# Patient Record
Sex: Female | Born: 1940 | Race: White | Hispanic: No | Marital: Single | Smoking: Former smoker
Health system: Southern US, Community
[De-identification: ages and names within clinical notes are randomized; demographics above are authoritative.]

## PROBLEM LIST (undated history)

## (undated) DIAGNOSIS — I482 Chronic atrial fibrillation, unspecified: Secondary | ICD-10-CM

## (undated) DIAGNOSIS — F0390 Unspecified dementia without behavioral disturbance: Secondary | ICD-10-CM

---

## 2008-05-17 ENCOUNTER — Ambulatory Visit: Payer: Self-pay | Admitting: Internal Medicine

## 2011-04-17 ENCOUNTER — Ambulatory Visit: Payer: Self-pay | Admitting: Internal Medicine

## 2016-04-04 ENCOUNTER — Encounter: Payer: Self-pay | Admitting: Emergency Medicine

## 2016-04-04 ENCOUNTER — Inpatient Hospital Stay: Payer: Medicare HMO

## 2016-04-04 ENCOUNTER — Emergency Department: Payer: Medicare HMO

## 2016-04-04 ENCOUNTER — Inpatient Hospital Stay
Admission: EM | Admit: 2016-04-04 | Discharge: 2016-04-07 | DRG: 065 | Disposition: A | Discharge status: Home / Self Care | Payer: Medicare HMO | Attending: Internal Medicine | Admitting: Internal Medicine

## 2016-04-04 DIAGNOSIS — Z87891 Personal history of nicotine dependence: Secondary | ICD-10-CM | POA: Diagnosis not present

## 2016-04-04 DIAGNOSIS — I63512 Cerebral infarction due to unspecified occlusion or stenosis of left middle cerebral artery: Principal | ICD-10-CM | POA: Diagnosis present

## 2016-04-04 DIAGNOSIS — G8191 Hemiplegia, unspecified affecting right dominant side: Secondary | ICD-10-CM | POA: Diagnosis present

## 2016-04-04 DIAGNOSIS — I4892 Unspecified atrial flutter: Secondary | ICD-10-CM | POA: Diagnosis present

## 2016-04-04 DIAGNOSIS — R29810 Facial weakness: Secondary | ICD-10-CM | POA: Diagnosis present

## 2016-04-04 DIAGNOSIS — N390 Urinary tract infection, site not specified: Secondary | ICD-10-CM | POA: Diagnosis present

## 2016-04-04 DIAGNOSIS — F0390 Unspecified dementia without behavioral disturbance: Secondary | ICD-10-CM | POA: Diagnosis present

## 2016-04-04 DIAGNOSIS — I639 Cerebral infarction, unspecified: Secondary | ICD-10-CM | POA: Diagnosis present

## 2016-04-04 DIAGNOSIS — I6339 Cerebral infarction due to thrombosis of other cerebral artery: Secondary | ICD-10-CM | POA: Diagnosis not present

## 2016-04-04 DIAGNOSIS — Z66 Do not resuscitate: Secondary | ICD-10-CM | POA: Diagnosis present

## 2016-04-04 DIAGNOSIS — I480 Paroxysmal atrial fibrillation: Secondary | ICD-10-CM | POA: Diagnosis present

## 2016-04-04 DIAGNOSIS — M6281 Muscle weakness (generalized): Secondary | ICD-10-CM

## 2016-04-04 DIAGNOSIS — Z23 Encounter for immunization: Secondary | ICD-10-CM | POA: Diagnosis not present

## 2016-04-04 DIAGNOSIS — Z8673 Personal history of transient ischemic attack (TIA), and cerebral infarction without residual deficits: Secondary | ICD-10-CM

## 2016-04-04 DIAGNOSIS — R4701 Aphasia: Secondary | ICD-10-CM | POA: Diagnosis present

## 2016-04-04 HISTORY — DX: Personal history of transient ischemic attack (TIA), and cerebral infarction without residual deficits: Z86.73

## 2016-04-04 HISTORY — DX: Unspecified dementia, unspecified severity, without behavioral disturbance, psychotic disturbance, mood disturbance, and anxiety: F03.90

## 2016-04-04 LAB — DIFFERENTIAL
Basophils Absolute: 0 10*3/uL (ref 0–0.1)
Basophils Relative: 1 %
EOS PCT: 2 %
Eosinophils Absolute: 0.1 10*3/uL (ref 0–0.7)
LYMPHS ABS: 1.6 10*3/uL (ref 1.0–3.6)
LYMPHS PCT: 29 %
Monocytes Absolute: 0.4 10*3/uL (ref 0.2–0.9)
Monocytes Relative: 7 %
NEUTROS ABS: 3.5 10*3/uL (ref 1.4–6.5)
NEUTROS PCT: 61 %

## 2016-04-04 LAB — ETHANOL: Alcohol, Ethyl (B): <5 mg/dL (ref <5)

## 2016-04-04 LAB — PROTIME-INR
INR: 1.03
Prothrombin Time: 13.5 seconds (ref 11.4–15.2)

## 2016-04-04 LAB — LIPID PANEL
CHOLESTEROL: 229 mg/dL — AB (ref 0–200)
HDL: 57 mg/dL (ref >40)
LDL Cholesterol: 155 mg/dL — ABNORMAL HIGH (ref 0–99)
Total CHOL/HDL Ratio: 4 RATIO
Triglycerides: 85 mg/dL (ref <150)
VLDL: 17 mg/dL (ref 0–40)

## 2016-04-04 LAB — CBC
HCT: 37.6 % (ref 35.0–47.0)
HEMOGLOBIN: 13 g/dL (ref 12.0–16.0)
MCH: 30.9 pg (ref 26.0–34.0)
MCHC: 34.6 g/dL (ref 32.0–36.0)
MCV: 89.2 fL (ref 80.0–100.0)
Platelets: 218 10*3/uL (ref 150–440)
RBC: 4.22 MIL/uL (ref 3.80–5.20)
RDW: 12.5 % (ref 11.5–14.5)
WBC: 5.7 10*3/uL (ref 3.6–11.0)

## 2016-04-04 LAB — COMPREHENSIVE METABOLIC PANEL
ALBUMIN: 4.2 g/dL (ref 3.5–5.0)
ALK PHOS: 59 U/L (ref 38–126)
ALT: 9 U/L — AB (ref 14–54)
ANION GAP: 4 — AB (ref 5–15)
AST: 21 U/L (ref 15–41)
BILIRUBIN TOTAL: 0.9 mg/dL (ref 0.3–1.2)
BUN: 20 mg/dL (ref 6–20)
CO2: 28 mmol/L (ref 22–32)
CREATININE: 0.81 mg/dL (ref 0.44–1.00)
Calcium: 9.1 mg/dL (ref 8.9–10.3)
Chloride: 105 mmol/L (ref 101–111)
GFR calc Af Amer: >60 mL/min (ref >60)
GFR calc non Af Amer: >60 mL/min (ref >60)
GLUCOSE: 139 mg/dL — AB (ref 65–99)
Potassium: 3.7 mmol/L (ref 3.5–5.1)
Sodium: 137 mmol/L (ref 135–145)
Total Protein: 7.2 g/dL (ref 6.5–8.1)

## 2016-04-04 LAB — GLUCOSE, CAPILLARY
GLUCOSE-CAPILLARY: 126 mg/dL — AB (ref 65–99)
GLUCOSE-CAPILLARY: 85 mg/dL (ref 65–99)

## 2016-04-04 LAB — TROPONIN I

## 2016-04-04 LAB — APTT: aPTT: 28 seconds (ref 24–36)

## 2016-04-04 MED ORDER — HEPARIN SODIUM (PORCINE) 5000 UNIT/ML IJ SOLN
5000.0000 [IU] | Freq: Three times a day (TID) | INTRAMUSCULAR | Status: DC
  Administered 2016-04-05 – 2016-04-07 (×8): 5000 [IU] via SUBCUTANEOUS
  Filled 2016-04-04 (×8): qty 1

## 2016-04-04 MED ORDER — SODIUM CHLORIDE 0.9% FLUSH
3.0000 mL | Freq: Two times a day (BID) | INTRAVENOUS | Status: DC
Start: 2016-04-04 — End: 2016-04-07
  Administered 2016-04-05 – 2016-04-07 (×5): 3 mL via INTRAVENOUS

## 2016-04-04 MED ORDER — ASPIRIN 81 MG PO CHEW: 324.0000 mg | CHEWABLE_TABLET | Freq: Once | ORAL | Status: DC

## 2016-04-04 MED ORDER — LORAZEPAM 2 MG/ML IJ SOLN
1.0000 mg | Freq: Once | INTRAMUSCULAR | Status: AC
  Administered 2016-04-04: 1 mg via INTRAVENOUS

## 2016-04-04 MED ORDER — LORAZEPAM 2 MG/ML IJ SOLN
INTRAMUSCULAR | Status: AC
  Administered 2016-04-04: 1 mg via INTRAVENOUS
  Filled 2016-04-04: qty 1

## 2016-04-04 NOTE — ED Notes (Signed)
Pt looked to the person standing on the right. Before she ignored the right side.

## 2016-04-04 NOTE — ED Notes (Signed)
Pt not able to lay still for mri - medication ordered and nurse gave.

## 2016-04-04 NOTE — ED Notes (Signed)
Pt back from ct

## 2016-04-04 NOTE — ED Triage Notes (Signed)
Pt went to bed last night per daughter and was normal. Today went in to feed her and noticed facial droop. Right facial droop noted. Pt dropped cup that was in right hand and has been off balance per daughter.

## 2016-04-04 NOTE — H&P (Signed)
[facility] - [place] at [facility]   PATIENT NAME: Sara Lowe    MR#:  [id number]  DATE OF BIRTH:  1941-02-08  DATE OF ADMISSION:  04/04/2016  PRIMARY CARE PHYSICIAN: No primary care provider on file.   REQUESTING/REFERRING PHYSICIAN: stafford  CHIEF COMPLAINT:   Chief Complaint  Patient presents with  . Facial Droop    HISTORY OF PRESENT ILLNESS: Sara Lowe  is a 75 y.o. female with a known history of dementia, and no other medical issues- lives with her daughter- Who is a LawyerCNA and home health care provider. Today morning- when pt woke up around 11 am- daughter found some weakness on right side of body- and she dropped cup of coffee. Normally pt is able to eat on her own, and walks with minimal support on climbing stairs. Also noted some facial weakness on right side today. Baseline is - no talking- just some mumbling of words, so not able to appreciate today. Nurse in ER noted- not safe to swallow. History obtained from daughter and grand daughters.  PAST MEDICAL HISTORY:   Past Medical History:  Diagnosis Date  . Dementia   . Dementia     PAST SURGICAL HISTORY: History reviewed. No pertinent surgical history.  SOCIAL HISTORY:  Social History  Substance Use Topics  . Smoking status: Former Games developermoker  . Smokeless tobacco: Not on file  . Alcohol use No    FAMILY HISTORY:  Family History  Problem Relation Age of Onset  . Alzheimer's disease Father     DRUG ALLERGIES: No Known Allergies  REVIEW OF SYSTEMS:   Not able to get ROS due to Dementia.  MEDICATIONS AT HOME:  Prior to Admission medications   Medication Sig Start Date End Date Taking? Authorizing Provider  cholecalciferol (VITAMIN D) 400 units TABS tablet Take 400 Units by mouth.   Yes Historical Provider, MD  Multiple Vitamin (MULTIVITAMIN) capsule Take 1 capsule by mouth daily.   Yes Historical Provider, MD      PHYSICAL EXAMINATION:   VITAL SIGNS: Blood pressure 120/66, pulse  65, temperature 99.2 F (37.3 C), temperature source Oral, resp. rate (!) 21, height 5\' 5"  (1.651 m), weight 56.7 kg (125 lb), SpO2 97 %.  GENERAL:  75 y.o.-year-old patient lying in the bed with no acute distress.  EYES: Pupils equal, round, reactive to light and accommodation. No scleral icterus. Extraocular muscles intact.  HEENT: Head atraumatic, normocephalic. Oropharynx and nasopharynx clear. Face is deviated towards left side with right sided weakness. NECK:  Supple, no jugular venous distention. No thyroid enlargement, no tenderness.  LUNGS: Normal breath sounds bilaterally, no wheezing, rales,rhonchi or crepitation. No use of accessory muscles of respiration.  CARDIOVASCULAR: S1, S2 normal. No murmurs, rubs, or gallops.  ABDOMEN: Soft, nontender, nondistended. Bowel sounds present. No organomegaly or mass.  EXTREMITIES: No pedal edema, cyanosis, or clubbing.  NEUROLOGIC: right sided facial weakness. Muscle strength 4/5 in left sided extremities and 3/5 on right sided. Sensation intact. Gait not checked.  PSYCHIATRIC: The patient is alert and not oriented x 3.  SKIN: No obvious rash, lesion, or ulcer.   LABORATORY PANEL:   CBC  Recent Labs Lab 04/04/16 1541  WBC 5.7  HGB 13.0  HCT 37.6  PLT 218  MCV 89.2  MCH 30.9  MCHC 34.6  RDW 12.5  LYMPHSABS 1.6  MONOABS 0.4  EOSABS 0.1  BASOSABS 0.0   ------------------------------------------------------------------------------------------------------------------  Chemistries   Recent Labs Lab 04/04/16 1541  NA 137  K 3.7  CL 105  CO2 28  GLUCOSE 139*  BUN 20  CREATININE 0.81  CALCIUM 9.1  AST 21  ALT 9*  ALKPHOS 59  BILITOT 0.9   ------------------------------------------------------------------------------------------------------------------ estimated creatinine clearance is 52.1 mL/min (by C-G formula based on SCr of 0.81  mg/dL). ------------------------------------------------------------------------------------------------------------------ No results for input(s): TSH, T4TOTAL, T3FREE, THYROIDAB in the last 72 hours.  Invalid input(s): FREET3   Coagulation profile  Recent Labs Lab 04/04/16 1541  INR 1.03   ------------------------------------------------------------------------------------------------------------------- No results for input(s): DDIMER in the last 72 hours. -------------------------------------------------------------------------------------------------------------------  Cardiac Enzymes  Recent Labs Lab 04/04/16 1541  TROPONINI <0.03   ------------------------------------------------------------------------------------------------------------------ Invalid input(s): POCBNP  ---------------------------------------------------------------------------------------------------------------  Urinalysis No results found for: COLORURINE, APPEARANCEUR, LABSPEC, PHURINE, GLUCOSEU, HGBUR, BILIRUBINUR, KETONESUR, PROTEINUR, UROBILINOGEN, NITRITE, LEUKOCYTESUR   RADIOLOGY: Ct Head Wo Contrast  Result Date: 04/04/2016 CLINICAL DATA:  Patient is with right-sided neglect. History of dementia. EXAM: CT HEAD WITHOUT CONTRAST TECHNIQUE: Contiguous axial images were obtained from the base of the skull through the vertex without intravenous contrast. COMPARISON:  None. FINDINGS: Brain: Ventricles and sulci are prominent compatible with atrophy. Periventricular and subcortical white matter hypodensity compatible with chronic microvascular ischemic changes. No evidence for acute cortically based infarct, intracranial hemorrhage, mass lesion or mass-effect. Vascular: No hyperdense vessel or unexpected calcification. Skull: Normal. Negative for fracture or focal lesion. Sinuses/Orbits: No acute finding. Other: None. IMPRESSION: No acute intracranial process. Chronic microvascular ischemic changes and  cortical atrophy. Electronically Signed   By: Annia Belt M.D.   On: 04/04/2016 16:05    EKG: Orders placed or performed during the hospital encounter of 04/04/16  . ED EKG  . ED EKG    IMPRESSION AND PLAN:  * CVA   CT head negatvie.    MRi brain, Echo, Carotid doppler.    PT and SLP eval.    Check Lipid panel and HBA1c.  * Dementia   Baseline.    May need NH placement now.   All the records are reviewed and case discussed with ED provider. Management plans discussed with the patient, family and they are in agreement.  CODE STATUS: Full   Daughter is HCPOA, and she never discussed this topic with her siblings.   I encouraged her to discuss this and let us know the decision. Code Status History    This patient does not have a recorded code status. Please follow your organizational policy for patients in this situation.      TOTAL TIME TAKING CARE OF THIS PATIENT: 50 minutes.    Altamese Dilling M.D on 04/04/2016   Between 7am to 6pm - Pager - [phone]  After 6pm go to [email or web address] - Social research officer, government  [facility]  Office  [phone]  CC: Primary care physician; No primary care provider on file.   Note: This dictation was prepared with Dragon dictation along with smaller phrase technology. Any transcriptional errors that result from this process are unintentional.

## 2016-04-04 NOTE — ED Notes (Signed)
Patient transported to MRI 

## 2016-04-04 NOTE — ED Notes (Signed)
Pt is ready to go to mri - then will go to the floor room 110. Family aware

## 2016-04-04 NOTE — ED Provider Notes (Signed)
[facility] Emergency Department Provider Note  ____________________________________________  Time seen: Approximately 3:46 PM  I have reviewed the triage vital signs and the nursing notes.   HISTORY  Chief Complaint Facial Droop  Level 5 caveat:  Portions of the history and physical were unable to be obtained due to dementia   HPI Sara Lowe is a 75 y.o. female with a history of dementia who presents for evaluation of right-sided facial droop. Patient last seen normal yesterday evening before bedtime. Patient lives with her daughter. Patient has advanced dementia with very poor communication skills. Daughter reports that this morning she woke up at noon and went to her mother's bedroom to bring her breakfast as she does every day. Patient did not talk to her daughter however that is common. She laid the tray on her lap and walked out of the room. She then heard a noise and walked back inside to patient's mother on the floor. She noticed the patient had her right hand that resting inside the plate with her food. She asked her mother if everything was okay and then noticed that patient had some drooling on the right side of her mouth. She has patient to move the right side of her body but patient was not following commands which is not necessarily uncommon for this patient. Patient is looking around in the room and awake but not following any commands and not answering any questions.   Past Medical History:  Diagnosis Date  . Dementia     There are no active problems to display for this patient.   History reviewed. No pertinent surgical history.  Prior to Admission medications   Not on File    Allergies Review of patient's allergies indicates no known allergies.  History reviewed. No pertinent family history.  Social History Social History  Substance Use Topics  . Smoking status: Former Games developermoker  . Smokeless tobacco: Not on file  . Alcohol use  No    Review of Systems Unable to obtain full ROS Constitutional: Negative for fever. Gastrointestinal: Negative for vomiting or diarrhea. Neurological: + R sided weakness  ____________________________________________   PHYSICAL EXAM:  VITAL SIGNS: ED Triage Vitals  Enc Vitals Group     BP 04/04/16 1528 123/65     Pulse Rate 04/04/16 1528 76     Resp 04/04/16 1528 18     Temp 04/04/16 1528 99.2 F (37.3 C)     Temp Source 04/04/16 1528 Oral     SpO2 04/04/16 1528 100 %     Weight 04/04/16 1525 125 lb (56.7 kg)     Height 04/04/16 1525 5\' 5"  (1.651 m)     Head Circumference --      Peak Flow --      Pain Score --      Pain Loc --      Pain Edu? --      Excl. in GC? --     Constitutional: Awake and will engage if I am standing on her left side, complete neglect of the right side, not answering to any questions. No distress.  HEENT:      Head: Normocephalic and atraumatic.         Eyes: Conjunctivae are normal. Sclera is non-icteric. EOMI. PERRL      Mouth/Throat: Mucous membranes are moist.       Neck: Supple with no signs of meningismus. Cardiovascular: Regular rate and rhythm. No murmurs, gallops, or rubs. 2+ symmetrical distal pulses  are present in all extremities. No JVD. Respiratory: Normal respiratory effort. Lungs are clear to auscultation bilaterally. No wheezes, crackles, or rhonchi.  Gastrointestinal: Soft, non tender, and non distended with positive bowel sounds. No rebound or guarding. Musculoskeletal: Nontender with normal range of motion in all extremities. No edema, cyanosis, or erythema of extremities. Neurologic: Aphasic, PERRL, R sided neglect, able to hold her RUE up for a few seconds if the extremity is held in her visual field, flaccid paralysis of the RLE, R nasolabial fold asymmetry, R side neglect. Skin: Skin is warm, dry and intact. No rash noted.   ____________________________________________   LABS (all labs ordered are listed, but only  abnormal results are displayed)  Labs Reviewed  COMPREHENSIVE METABOLIC PANEL - Abnormal; Notable for the following:       Result Value   Glucose, Bld 139 (*)    ALT 9 (*)    Anion gap 4 (*)    All other components within normal limits  GLUCOSE, CAPILLARY - Abnormal; Notable for the following:    Glucose-Capillary 126 (*)    All other components within normal limits  ETHANOL  PROTIME-INR  APTT  CBC  DIFFERENTIAL  TROPONIN I  URINE RAPID DRUG SCREEN, HOSP PERFORMED  URINALYSIS COMPLETEWITH MICROSCOPIC ([facility] ONLY)   ____________________________________________  EKG  ED ECG REPORT I, Nita Sickle, the attending physician, personally viewed and interpreted this ECG.  Normal sinus rhythm, rate of 68, normal intervals, normal axis, no ST elevations or depressions.  ____________________________________________  RADIOLOGY  Head CT: Negative  ____________________________________________   PROCEDURES  Procedure(s) performed: None Procedures Critical Care performed:  None ____________________________________________   INITIAL IMPRESSION / ASSESSMENT AND PLAN / ED COURSE  75 y.o. female with a history of dementia who presents for evaluation of stroke symptoms. Last seen normal > 15 hours PTA. Neuro exam limited due to patient's dementia however showing aphasic, PERRL, R sided neglect, able to hold her RUE up for a few seconds if the extremity is held in her visual field, flaccid paralysis of the RLE, R nasolabial fold asymmetry, R side neglect. Patient not candidate for TPA at this time. We'll get a head CT, EKG, basic labs and discussed with the hospitalist for admission.  NIH Stroke Scale  Interval: Baseline Time: 4:42 PM Person Administering Scale: [place]  Administer stroke scale items in the order listed. Record performance in each category after each subscale exam. Do not go back and change scores. Follow directions provided for each exam technique.  Scores should reflect what the patient does, not what the clinician thinks the patient can do. The clinician should record answers while administering the exam and work quickly. Except where indicated, the patient should not be coached (i.e., repeated requests to patient to make a special effort).   1a  Level of consciousness: 1=not alert but arousable by minor stimulation to obey, answer or respond  1b. LOC questions:  2=Performs neither task correctly  1c. LOC commands: 2=Performs neither task correctly  2.  Best Gaze: 0=normal - unable to obtain  3.  Visual: 0=No visual loss- unable to obtain  4. Facial Palsy: 1=Minor paralysis (flattened nasolabial fold, asymmetric on smiling)  5a.  Motor left arm: 0=No drift, limb holds 90 (or 45) degrees for full 10 seconds  5b.  Motor right arm: 1=Drift, limb holds 90 (or 45) degrees but drifts down before full 10 seconds: does not hit bed  6a. motor left leg: 0=No drift, limb holds 90 (or 45) degrees  for full 10 seconds  6b  Motor right leg:  4=No movement  7. Limb Ataxia: 0=Absent- unable to obtain  8.  Sensory: 2=Severe to total sensory loss; patient is not aware of being touched in face, arm, leg  9. Best Language:  3=Mute, global aphasia; no usable speech or auditory comprehension  10. Dysarthria: 0=Normal- unable to obtain  11. Extinction and Inattention: 2=Profound hemi-inattention or hemi-inattention to more than one modality. Does not recognize own hand or orients only to one side of space   Total:   18     Clinical Course  Comment By Time  CT negative for hemorrhagic stroke. Full ASA given. Will admit to hospitalist service. Nita Sickle, MD 09/16 1642    Pertinent labs & imaging results that were available during my care of the patient were reviewed by me and considered in my medical decision making (see chart for details).    ____________________________________________   FINAL CLINICAL IMPRESSION(S) / ED DIAGNOSES  Final  diagnoses:  Cerebral infarction due to unspecified mechanism      NEW MEDICATIONS STARTED DURING THIS VISIT:  New Prescriptions   No medications on file     Note:  This document was prepared using Dragon voice recognition software and may include unintentional dictation errors.    Nita Sickle, MD 04/04/16 [phone]

## 2016-04-05 ENCOUNTER — Inpatient Hospital Stay
Admit: 2016-04-05 | Discharge: 2016-04-05 | Disposition: A | Discharge status: Home / Self Care | Payer: Medicare HMO | Attending: Internal Medicine | Admitting: Internal Medicine

## 2016-04-05 ENCOUNTER — Inpatient Hospital Stay: Payer: Medicare HMO

## 2016-04-05 DIAGNOSIS — I6339 Cerebral infarction due to thrombosis of other cerebral artery: Secondary | ICD-10-CM

## 2016-04-05 LAB — URINE DRUG SCREEN, QUALITATIVE (ARMC ONLY)
Amphetamines, Ur Screen: NOT DETECTED
Barbiturates, Ur Screen: NOT DETECTED
Benzodiazepine, Ur Scrn: NOT DETECTED
CANNABINOID 50 NG, UR ~~LOC~~: NOT DETECTED
COCAINE METABOLITE, UR ~~LOC~~: NOT DETECTED
MDMA (ECSTASY) UR SCREEN: NOT DETECTED
Methadone Scn, Ur: NOT DETECTED
Opiate, Ur Screen: NOT DETECTED
PHENCYCLIDINE (PCP) UR S: NOT DETECTED
Tricyclic, Ur Screen: NOT DETECTED

## 2016-04-05 LAB — BASIC METABOLIC PANEL
ANION GAP: 6 (ref 5–15)
BUN: 22 mg/dL — AB (ref 6–20)
CO2: 27 mmol/L (ref 22–32)
Calcium: 9.1 mg/dL (ref 8.9–10.3)
Chloride: 106 mmol/L (ref 101–111)
Creatinine, Ser: 0.7 mg/dL (ref 0.44–1.00)
Glucose, Bld: 104 mg/dL — ABNORMAL HIGH (ref 65–99)
POTASSIUM: 3.8 mmol/L (ref 3.5–5.1)
SODIUM: 139 mmol/L (ref 135–145)

## 2016-04-05 LAB — URINALYSIS COMPLETE WITH MICROSCOPIC (ARMC ONLY)
BILIRUBIN URINE: NEGATIVE
GLUCOSE, UA: NEGATIVE mg/dL
NITRITE: NEGATIVE
Protein, ur: NEGATIVE mg/dL
SPECIFIC GRAVITY, URINE: 1.019 (ref 1.005–1.030)
pH: 5 (ref 5.0–8.0)

## 2016-04-05 LAB — CBC
HEMATOCRIT: 36.4 % (ref 35.0–47.0)
HEMOGLOBIN: 12.7 g/dL (ref 12.0–16.0)
MCH: 31.3 pg (ref 26.0–34.0)
MCHC: 35 g/dL (ref 32.0–36.0)
MCV: 89.5 fL (ref 80.0–100.0)
Platelets: 187 10*3/uL (ref 150–440)
RBC: 4.06 MIL/uL (ref 3.80–5.20)
RDW: 12.4 % (ref 11.5–14.5)
WBC: 5.2 10*3/uL (ref 3.6–11.0)

## 2016-04-05 LAB — HEMOGLOBIN A1C
Hgb A1c MFr Bld: 5.3 % (ref 4.8–5.6)
MEAN PLASMA GLUCOSE: 105 mg/dL

## 2016-04-05 LAB — GLUCOSE, CAPILLARY: GLUCOSE-CAPILLARY: 93 mg/dL (ref 65–99)

## 2016-04-05 MED ORDER — CEFUROXIME AXETIL 250 MG PO TABS: 500.0000 mg | ORAL_TABLET | Freq: Two times a day (BID) | ORAL | Status: DC

## 2016-04-05 MED ORDER — ASPIRIN 300 MG RE SUPP
300.0000 mg | Freq: Once | RECTAL | Status: AC
  Administered 2016-04-05: 03:00:00 300 mg via RECTAL
  Filled 2016-04-05: qty 1

## 2016-04-05 MED ORDER — DEXTROSE-NACL 5-0.45 % IV SOLN
INTRAVENOUS | Status: AC
  Administered 2016-04-05: 03:00:00 via INTRAVENOUS

## 2016-04-05 MED ORDER — DEXTROSE 5 % IV SOLN
1.0000 g | INTRAVENOUS | Status: DC
  Administered 2016-04-05 – 2016-04-07 (×3): 1 g via INTRAVENOUS
  Filled 2016-04-05 (×3): qty 10

## 2016-04-05 MED ORDER — INFLUENZA VAC SPLIT QUAD 0.5 ML IM SUSY
0.5000 mL | PREFILLED_SYRINGE | INTRAMUSCULAR | Status: AC
  Administered 2016-04-06: 13:00:00 0.5 mL via INTRAMUSCULAR
  Filled 2016-04-05: qty 0.5

## 2016-04-05 MED ORDER — LORAZEPAM 2 MG/ML IJ SOLN
1.0000 mg | Freq: Once | INTRAMUSCULAR | Status: AC
  Administered 2016-04-05: 15:00:00 1 mg via INTRAVENOUS
  Filled 2016-04-05: qty 1

## 2016-04-05 MED ORDER — DEXTROSE-NACL 5-0.45 % IV SOLN
INTRAVENOUS | Status: AC
  Administered 2016-04-05: 17:00:00 via INTRAVENOUS

## 2016-04-05 MED ORDER — METOPROLOL TARTRATE 5 MG/5ML IV SOLN
5.0000 mg | Freq: Four times a day (QID) | INTRAVENOUS | Status: DC | PRN
  Administered 2016-04-05 – 2016-04-06 (×2): 5 mg via INTRAVENOUS
  Filled 2016-04-05 (×3): qty 5

## 2016-04-05 NOTE — Clinical Social Work Note (Signed)
CSW received consult for Advanced Directives. Chaplains conduct this function. CSW will alert the patient's nurse to consult Chaplain. CSW signing off.  Argentina PonderKaren Martha Marina Boerner, MSW, LCSW-A [phone]

## 2016-04-05 NOTE — Progress Notes (Signed)
Pharmacy Antibiotic Note  Sara LimaJerri S Lowe is a 75 y.o. female admitted on 04/04/2016 with UTI.  Pharmacy has been consulted for ceftriaxone dosing.  Plan: Ceftriaxone 1 g IV q24h  Height: 5\' 5"  (165.1 cm) Weight: 108 lb 1.6 oz (49 kg) IBW/kg (Calculated) : 57  Temp (24hrs), Avg:98.1 F (36.7 C), Min:97.6 F (36.4 C), Max:99.2 F (37.3 C)   Recent Labs Lab 04/04/16 1541 04/05/16 0421  WBC 5.7 5.2  CREATININE 0.81 0.70    Estimated Creatinine Clearance: 45.6 mL/min (by C-G formula based on SCr of 0.7 mg/dL).    No Known Allergies  Antimicrobials this admission: ceftriaxone 9/17 >>   Dose adjustments this admission:  Microbiology results: 9/17 UCx: Sent   Thank you for allowing pharmacy to be a part of this patient's care.  Cindi CarbonMary M Iridiana Fonner, PharmD, BCPS Clinical Pharmacist 04/05/2016 7:50 AM

## 2016-04-05 NOTE — Progress Notes (Signed)
PT Cancellation Note  Patient Details Name: Sara LimaJerri S Bostock MRN: [id number] DOB: 1940-09-06   Cancelled Treatment:    Reason Eval/Treat Not Completed: Medical issues which prohibited therapy Spoke with nursing who reports that pt's HR has been highly variable with spikes to 140.  Pt unable to stay still for the MRI earlier today, slated to try again tomorrow.  Decided with the nurse to hold for today and re-visit tomorrow.  However pt wanting to try and get up to chair and PT entered room to assist - her HR was in the 130s on arrival, then went up to 150 while she (confusedly) attempted to get to recliner. PT assisted pt back to bed and instructed family that pt should likely stay in bed for the near future until HR calms down more. Will try back tomorrow if pt is appropriate.   Malachi ProGalen R Tata Timmins, DPT 04/05/2016, 2:10 PM

## 2016-04-05 NOTE — Consult Note (Signed)
Reason for Consult: R sided weakness  Referring Physician: Dr. Juliene Pina  CC: R sided weakness   HPI: Sara Lowe is an 75 y.o. female  with a known history of dementia, and no other medical issues per daughter whom she lives with for the past 15 years who is a Lawyer and home health care provider. At baseline pt is minimally verbal and needs assistance with daily activities.  Pt was noted around 11 Am yesterday to have her hand in the plate and drooling from R side of face.   As per daughter her aphasia is much worse as she states at times pt can mumble few words out.    CTH no acute abnormality  Past Medical History:  Diagnosis Date  . Dementia   . Dementia     History reviewed. No pertinent surgical history.  Family History  Problem Relation Age of Onset  . Alzheimer's disease Father     Social History:  reports that she has quit smoking. She has never used smokeless tobacco. She reports that she does not drink alcohol or use drugs.  No Known Allergies  Medications: I have reviewed the patient's current medications.  ROS: Unable to obtain as pt is aphasic.   Physical Examination: Blood pressure (!) 99/35, pulse 62, temperature 98 F (36.7 C), temperature source Oral, resp. rate 18, height 5\' 5"  (1.651 m), weight 49 kg (108 lb 1.6 oz), SpO2 99 %.   Neurological Examination Mental Status: Able to look at me. Not stating her name. Mute at times and others tries to [employer].   Cranial Nerves: II: Discs flat bilaterally; Visual fields grossly normal, pupils equal, round, reactive to light and accommodation III,IV, VI: ptosis not present, extra-ocular motions intact bilaterally V,VII: R mild facial droop  VIII: hearing normal bilaterally IX,X: gag reflex present XI: bilateral shoulder shrug XII: midline tongue extension Motor: Right : Upper extremity   4/5    Left:     Upper extremity   4+/5  Lower extremity   4/5     Lower extremity   4+/5 Tone and bulk:normal tone  throughout; no atrophy noted Sensory: Pinprick and light touch intact throughout, bilaterally Deep Tendon Reflexes: 1+ and symmetric throughout Plantars: Right: downgoing   Left: downgoing Cerebellar: Not tested Gait: not tested         Laboratory Studies:   Basic Metabolic Panel:  Recent Labs Lab 04/04/16 1541 04/05/16 0421  NA 137 139  K 3.7 3.8  CL 105 106  CO2 28 27  GLUCOSE 139* 104*  BUN 20 22*  CREATININE 0.81 0.70  CALCIUM 9.1 9.1    Liver Function Tests:  Recent Labs Lab 04/04/16 1541  AST 21  ALT 9*  ALKPHOS 59  BILITOT 0.9  PROT 7.2  ALBUMIN 4.2   No results for input(s): LIPASE, AMYLASE in the last 168 hours. No results for input(s): AMMONIA in the last 168 hours.  CBC:  Recent Labs Lab 04/04/16 1541 04/05/16 0421  WBC 5.7 5.2  NEUTROABS 3.5  --   HGB 13.0 12.7  HCT 37.6 36.4  MCV 89.2 89.5  PLT 218 187    Cardiac Enzymes:  Recent Labs Lab 04/04/16 1541  TROPONINI <0.03    BNP: Invalid input(s): POCBNP  CBG:  Recent Labs Lab 04/04/16 1542 04/04/16 2042 04/05/16 0240  GLUCAP 126* 85 93    Microbiology: No results found for this or any previous visit.  Coagulation Studies:  Recent Labs  04/04/16 1541  LABPROT  13.5  INR 1.03    Urinalysis:  Recent Labs Lab 04/05/16 0259  COLORURINE YELLOW*  LABSPEC 1.019  PHURINE 5.0  GLUCOSEU NEGATIVE  HGBUR 1+*  BILIRUBINUR NEGATIVE  KETONESUR TRACE*  PROTEINUR NEGATIVE  NITRITE NEGATIVE  LEUKOCYTESUR 2+*    Lipid Panel:     Component Value Date/Time   CHOL 229 (H) 04/04/2016 1541   TRIG 85 04/04/2016 1541   HDL 57 04/04/2016 1541   CHOLHDL 4.0 04/04/2016 1541   VLDL 17 04/04/2016 1541   LDLCALC 155 (H) 04/04/2016 1541    HgbA1C: No results found for: HGBA1C  Urine Drug Screen:     Component Value Date/Time   LABOPIA NONE DETECTED 04/05/2016 0259   COCAINSCRNUR NONE DETECTED 04/05/2016 0259   LABBENZ NONE DETECTED 04/05/2016 0259   AMPHETMU NONE  DETECTED 04/05/2016 0259   THCU NONE DETECTED 04/05/2016 0259   LABBARB NONE DETECTED 04/05/2016 0259    Alcohol Level:  Recent Labs Lab 04/04/16 1541  ETH <5     Imaging: Ct Head Wo Contrast  Result Date: 04/04/2016 CLINICAL DATA:  Patient is with right-sided neglect. History of dementia. EXAM: CT HEAD WITHOUT CONTRAST TECHNIQUE: Contiguous axial images were obtained from the base of the skull through the vertex without intravenous contrast. COMPARISON:  None. FINDINGS: Brain: Ventricles and sulci are prominent compatible with atrophy. Periventricular and subcortical white matter hypodensity compatible with chronic microvascular ischemic changes. No evidence for acute cortically based infarct, intracranial hemorrhage, mass lesion or mass-effect. Vascular: No hyperdense vessel or unexpected calcification. Skull: Normal. Negative for fracture or focal lesion. Sinuses/Orbits: No acute finding. Other: None. IMPRESSION: No acute intracranial process. Chronic microvascular ischemic changes and cortical atrophy. Electronically Signed   By: Annia Beltrew  Davis M.D.   On: 04/04/2016 16:05     Assessment/Plan: 75 y.o. female  with a known history of dementia, and no other medical issues per daughter whom she lives with for the past 15 years who is a LawyerCNA and home health care provider. At baseline pt is minimally verbal and needs assistance with daily activities.  Pt was noted around 11 Am yesterday to have her hand in the plate and drooling from R side of face.   As per daughter her aphasia is much worse as she states at times pt can mumble few words out.  CTH no acute abnormalities.  Failed MRI yesterday due to agitation  Strength R side improved as per daughter but speech much worse then baseline.   Suspect L frontal ischemia  Stroke work up pending with  MRI brain/MRA H/N Echo Lipid HbA1c Pt was not on any medications at home ASA rectal until swallow eval can be done.   Pauletta BrownsZEYLIKMAN,  Marx Doig  04/05/2016, 11:13 AM

## 2016-04-05 NOTE — Progress Notes (Signed)
Chaplain rounded the unit to provide an Advance Directive. Patient laid in bed smiling at times as the daughter stated that she will take the information home to discuss with family.  The daughter was advised to contact the nurse's station for a Chaplain should she have any concerns.  The patient was not verable during the visit. Jefm PettyChaplain Lougenia Morrissey [phone]

## 2016-04-05 NOTE — Progress Notes (Signed)
Patients hr 90-110's, not sustaining >120. Metoprolol prn not given. Will continue to monitor and given when hr sustains >120. Pt resting with eyes closed, no signs or symptoms of pain or distress noted at present.

## 2016-04-05 NOTE — Progress Notes (Addendum)
Notified Dr Juliene PinaMody that pt HR spikes to 140's  with minimal exertion and sustained 130's-140's for few minutes. Pt seems agitated and not let staff to check VS. Per MD to give 1mg  ativan IV push and metorolol IV qx6hrs prn for HR>120.

## 2016-04-05 NOTE — Plan of Care (Signed)
Problem: Safety: Goal: Ability to remain free from injury will improve Outcome: Progressing Per daughter patient was steady gait prior to admission, pt given ativan for MRI and lethargic since arrival to room. High fall risk initiated per policy, bed alarm turned on and patients daughter Darl Pikes(Susan) educated concerning high fall risk, yellow skid proof socks, assistance while out of bed and bed alarm   Problem: Nutrition: Goal: Risk of aspiration will decrease Outcome: Progressing Pt failed bedside stroke swallow evaluation in the ED. Pt is now NPO until speech therapy evaluates the patient. Aspiration precautions initiated on arrival to room 110. Goal: Dietary intake will improve Outcome: Not Progressing Pt is currently NPO until speech therapy can complete evaluation. Provide mouth care with each neuro assessment and as needed.

## 2016-04-05 NOTE — Progress Notes (Signed)
MD notified, HR up to 150's. Pt has prn metoprolol order q6 hour for hr>120. Order obtained, may given metoprolol 5mg  IV once now for hr >120.

## 2016-04-05 NOTE — Plan of Care (Signed)
Problem: Education: Goal: Knowledge of disease or condition will improve Outcome: Not Progressing Pt has H/O dementia, mumbles at times and unable to follow commands. Educated family about stroke. Family verbalized understanding.  Problem: Nutrition: Goal: Dietary intake will improve Outcome: Not Progressing Pt needs to be seen by speech therapist. Currently NPO. IVF infusing.  Problem: Tissue Perfusion: Goal: Cerebral tissue perfusion will improve (applicable to all stroke diagnoses) Outcome: Progressing No neuro changes noted during the shift. Unable to get NIH score due to pt unable to follow commands. Cardiac rhythm changed today from NSR to A fib/A flutter HR was in the 140's. Seen by cardiologist. ECHO pending. Ativan given once for agitation. Metoprolol IV ordered and given for HR>120 with improvement.

## 2016-04-05 NOTE — Consult Note (Signed)
[facility] Cardiology  CARDIOLOGY CONSULT NOTE  Patient ID: Sara LimaJerri S Hoppe MRN: [id number] DOB/AGE: [birth year] 77 y.o.  Admit date: 04/04/2016 Referring Physician Mody Primary Physician [facility]  Reason for Consultation Atrial fibrillation  HPI: 75 year old female referred for evaluation of atrial fibrillation. The patient has known history of baseline dementia, currently lives with her daughter. Patient was a use state of health until 04/04/2016 when she developed right-sided weakness, slurring of speech, right-sided neglect, inability to eat on her own, or walk. Head CT was negative. Brain MRI and carotid ultrasound is pending. On telemetry, the patient's had intermittent atrial fibrillation/atrial flutter, 90-110 bpm, appears asymptomatic.  Review of systems complete and found to be negative unless listed above     Past Medical History:  Diagnosis Date  . Dementia   . Dementia     History reviewed. No pertinent surgical history.  Prescriptions Prior to Admission  Medication Sig Dispense Refill Last Dose  . cholecalciferol (VITAMIN D) 400 units TABS tablet Take 400 Units by mouth.   Past Week at Unknown time  . Multiple Vitamin (MULTIVITAMIN) capsule Take 1 capsule by mouth daily.      Social History   Social History  . Marital status: Single    Spouse name: N/A  . Number of children: N/A  . Years of education: N/A   Occupational History  . Not on file.   Social History Main Topics  . Smoking status: Former Games developermoker  . Smokeless tobacco: Never Used  . Alcohol use No  . Drug use: No  . Sexual activity: Not on file   Other Topics Concern  . Not on file   Social History Narrative  . No narrative on file    Family History  Problem Relation Age of Onset  . Alzheimer's disease Father       Review of systems complete and found to be negative unless listed above      PHYSICAL EXAM  General: Well developed, well nourished, in no acute  distress HEENT:  Normocephalic and atramatic Neck:  No JVD.  Lungs: Clear bilaterally to auscultation and percussion. Heart: HRRR . Normal S1 and S2 without gallops or murmurs.  Abdomen: Bowel sounds are positive, abdomen soft and non-tender  Msk:  Back normal, normal gait. Normal strength and tone for age. Extremities: No clubbing, cyanosis or edema.   Neuro: Alert and oriented X 3. Psych:  Good affect, responds appropriately  Labs:   Lab Results  Component Value Date   WBC 5.2 04/05/2016   HGB 12.7 04/05/2016   HCT 36.4 04/05/2016   MCV 89.5 04/05/2016   PLT 187 04/05/2016    Recent Labs Lab 04/04/16 1541 04/05/16 0421  NA 137 139  K 3.7 3.8  CL 105 106  CO2 28 27  BUN 20 22*  CREATININE 0.81 0.70  CALCIUM 9.1 9.1  PROT 7.2  --   BILITOT 0.9  --   ALKPHOS 59  --   ALT 9*  --   AST 21  --   GLUCOSE 139* 104*   Lab Results  Component Value Date   TROPONINI <0.03 04/04/2016    Lab Results  Component Value Date   CHOL 229 (H) 04/04/2016   Lab Results  Component Value Date   HDL 57 04/04/2016   Lab Results  Component Value Date   LDLCALC 155 (H) 04/04/2016   Lab Results  Component Value Date   TRIG 85 04/04/2016   Lab Results  Component Value Date  CHOLHDL 4.0 04/04/2016   No results found for: LDLDIRECT    Radiology: Ct Head Wo Contrast  Result Date: 04/04/2016 CLINICAL DATA:  Patient is with right-sided neglect. History of dementia. EXAM: CT HEAD WITHOUT CONTRAST TECHNIQUE: Contiguous axial images were obtained from the base of the skull through the vertex without intravenous contrast. COMPARISON:  None. FINDINGS: Brain: Ventricles and sulci are prominent compatible with atrophy. Periventricular and subcortical white matter hypodensity compatible with chronic microvascular ischemic changes. No evidence for acute cortically based infarct, intracranial hemorrhage, mass lesion or mass-effect. Vascular: No hyperdense vessel or unexpected calcification.  Skull: Normal. Negative for fracture or focal lesion. Sinuses/Orbits: No acute finding. Other: None. IMPRESSION: No acute intracranial process. Chronic microvascular ischemic changes and cortical atrophy. Electronically Signed   By: Annia Belt M.D.   On: 04/04/2016 16:05    EKG: Normal sinus rhythm  ASSESSMENT AND PLAN:   1. Paroxysmal atrial fibrillation/atrial flutter, with mild tachycardia, appears asymptomatic, in the setting of acute CVA. Brain MRI and carotid ultrasound pending. Patient currently nothing by mouth, awaiting swallowing study.  Recommendations  1. Agree with overall current therapy 2. Start Eliquis 5 mg twice a day when patient able to swallow. Discussed with family members the the risks, benefits and alternatives of chronic anticoagulation, as well as, the advantages and disadvantages warfarin versus a novel anticoagulants. 3. Start metoprolol titrate 25 mg twice a day when patient able to swallow safely 4. Review 2-D echocardiogram  Signed: Nykira Reddix MD,PhD, [facility] 04/05/2016, 1:06 PM

## 2016-04-05 NOTE — Progress Notes (Signed)
[facility] [place] [facility]   PATIENT NAME: Sara Lowe    MR#:  [id number]  DATE OF BIRTH:  1940-12-21  SUBJECTIVE:   Patient With right-sided neglect Not speaking.  REVIEW OF SYSTEMS:    Review of Systems  Unable to perform ROS: Mental status change    Tolerating Diet:Nothing by mouth      DRUG ALLERGIES:  No Known Allergies  VITALS:  Blood pressure (!) 99/35, pulse 62, temperature 98 F (36.7 C), temperature source Oral, resp. rate 18, height 5\' 5"  (1.651 m), weight 49 kg (108 lb 1.6 oz), SpO2 99 %.  PHYSICAL EXAMINATION:   Physical Exam  Constitutional: She is well-developed, well-nourished, and in no distress. No distress.  HENT:  Head: Normocephalic.  Eyes: No scleral icterus.  Neck: Normal range of motion. Neck supple. No JVD present. No tracheal deviation present.  Cardiovascular: Normal rate, regular rhythm and normal heart sounds.  Exam reveals no gallop and no friction rub.   No murmur heard. Pulmonary/Chest: Effort normal and breath sounds normal. No respiratory distress. She has no wheezes. She has no rales. She exhibits no tenderness.  Abdominal: Soft. Bowel sounds are normal. She exhibits no distension and no mass. There is no tenderness. There is no rebound and no guarding.  Musculoskeletal: Normal range of motion. She exhibits no edema.  Neurological: She is alert.  Right facial neglect and right facial droop Does not follow commands well however she is alert holding her baby doll  Skin: Skin is warm. No rash noted. No erythema.  Psychiatric:  Patient not speaking      LABORATORY PANEL:   CBC  Recent Labs Lab 04/05/16 0421  WBC 5.2  HGB 12.7  HCT 36.4  PLT 187   ------------------------------------------------------------------------------------------------------------------  Chemistries   Recent Labs Lab 04/04/16 1541 04/05/16 0421  NA 137 139  K 3.7 3.8  CL 105 106  CO2 28 27  GLUCOSE 139* 104*   BUN 20 22*  CREATININE 0.81 0.70  CALCIUM 9.1 9.1  AST 21  --   ALT 9*  --   ALKPHOS 59  --   BILITOT 0.9  --    ------------------------------------------------------------------------------------------------------------------  Cardiac Enzymes  Recent Labs Lab 04/04/16 1541  TROPONINI <0.03   ------------------------------------------------------------------------------------------------------------------  RADIOLOGY:  Ct Head Wo Contrast  Result Date: 04/04/2016 CLINICAL DATA:  Patient is with right-sided neglect. History of dementia. EXAM: CT HEAD WITHOUT CONTRAST TECHNIQUE: Contiguous axial images were obtained from the base of the skull through the vertex without intravenous contrast. COMPARISON:  None. FINDINGS: Brain: Ventricles and sulci are prominent compatible with atrophy. Periventricular and subcortical white matter hypodensity compatible with chronic microvascular ischemic changes. No evidence for acute cortically based infarct, intracranial hemorrhage, mass lesion or mass-effect. Vascular: No hyperdense vessel or unexpected calcification. Skull: Normal. Negative for fracture or focal lesion. Sinuses/Orbits: No acute finding. Other: None. IMPRESSION: No acute intracranial process. Chronic microvascular ischemic changes and cortical atrophy. Electronically Signed   By: Annia Beltrew  Davis M.D.   On: 04/04/2016 16:05     ASSESSMENT AND PLAN:   75 year old female with dementia who presents with right-sided neglect and right-sided weakness with right facial droop   1. 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 She was unable to stay steady for MRI. We will repeat this later today. Exam follow up on neurology consult. Continue aspirin PR. Next on obtain speech consultation as she did not pass her swallow ablation. Patient needs PT and OT  consult. Patient will need clinical social worker at discharge. Follow-up on carotid Doppler and echocardiogram.   2.  Urinary tract infection: I will order urine culture and start Rocephin.  3. Dementia: Watch out for delirium.   Management plans discussed with the patient's daughter and she is in agreement.  CODE STATUS: Full  TOTAL TIME TAKING CARE OF THIS PATIENT: 30 minutes.     POSSIBLE D/C 1-2 days, DEPENDING ON CLINICAL CONDITION.   Sara Lowe M.D on 04/05/2016 at 10:18 AM  Between 7am to 6pm - Pager - [phone] After 6pm go to [email or web address] - Social research officer, government  [facility]  Office  [phone]  CC: Primary care physician; No primary care provider on file.  Note: This dictation was prepared with Dragon dictation along with smaller phrase technology. Any transcriptional errors that result from this process are unintentional.

## 2016-04-05 NOTE — Progress Notes (Signed)
Notified Dr Juliene PinaMody that cardiac rhythm changed from SR to A flutter, A fib. HR was in the 140's for few seconds per telemetry clerk. Currently HR 110-120. Pt currently in US. No signs of distress noted. No new orders at this time.

## 2016-04-06 ENCOUNTER — Inpatient Hospital Stay: Payer: Medicare HMO

## 2016-04-06 DIAGNOSIS — I639 Cerebral infarction, unspecified: Secondary | ICD-10-CM

## 2016-04-06 LAB — URINE CULTURE: CULTURE: NO GROWTH

## 2016-04-06 LAB — ECHOCARDIOGRAM COMPLETE
Height: 65 in
Weight: 1729.6 oz

## 2016-04-06 MED ORDER — LORAZEPAM 2 MG/ML IJ SOLN
2.0000 mg | Freq: Once | INTRAMUSCULAR | Status: AC
  Administered 2016-04-06: 2 mg via INTRAVENOUS
  Filled 2016-04-06: qty 1

## 2016-04-06 MED ORDER — LORAZEPAM BOLUS VIA INFUSION: 2.0000 mg | Freq: Once | INTRAVENOUS | Status: DC

## 2016-04-06 MED ORDER — ASPIRIN 300 MG RE SUPP
300.0000 mg | Freq: Every day | RECTAL | Status: DC
  Administered 2016-04-06 – 2016-04-07 (×2): 300 mg via RECTAL
  Filled 2016-04-06 (×2): qty 1

## 2016-04-06 MED ORDER — ROSUVASTATIN CALCIUM 5 MG PO TABS
10.0000 mg | ORAL_TABLET | Freq: Every day | ORAL | Status: DC
  Administered 2016-04-07: 10 mg via ORAL
  Filled 2016-04-06: qty 2

## 2016-04-06 MED ORDER — DEXTROSE-NACL 5-0.45 % IV SOLN
INTRAVENOUS | Status: AC
  Administered 2016-04-06: 09:00:00 via INTRAVENOUS

## 2016-04-06 NOTE — Care Management (Signed)
Admitted to [facility] with the diagnosis of stroke. Lives with daughter, Coy SaunasSusan Abbott, [phone]). Last seen Dr. Quillian QuinceBliss 2 months ago. [facility] [place]. No home Health. No skilled facility. No  Home oxygen. Uses no aids for ambulation. Wheelchair in the home, if needed.  Daughter helps with baths and dressing, self feed. No falls. Good appetite. Family will transport. Gwenette GreetBrenda S Adora Yeh RN MSN CCM Care Management [phone]

## 2016-04-06 NOTE — Progress Notes (Signed)
Subjective: Patient laying in bed with no evidence of discomfort.    Objective: Current vital signs: BP (!) 105/58 (BP Location: Right Arm)   Pulse (!) 132   Temp 98 F (36.7 C) (Oral)   Resp 18   Ht 5\' 5"  (1.651 m)   Wt 49 kg (108 lb 1.6 oz)   SpO2 97%   BMI 17.99 kg/m  Vital signs in last 24 hours: Temp:  [97.6 F (36.4 C)-98.5 F (36.9 C)] 98 F (36.7 C) (09/18 0501) Pulse Rate:  [72-132] 132 (09/18 0900) Resp:  [18-20] 18 (09/18 0900) BP: (97-135)/(52-113) 105/58 (09/18 0900) SpO2:  [97 %-99 %] 97 % (09/18 0900)  Intake/Output from previous day: 09/17 0701 - 09/18 0700 In: 1001.2 [P.O.:50; I.V.:951.2] Out: -  Intake/Output this shift: No intake/output data recorded. Nutritional status: DIET DYS 3 Room service appropriate? No; Fluid consistency: Thin  Neurologic Exam: Mental Status: Alert and awake.  Speech slurred and only mumbling noted.  Able to follow simple commands. Cranial Nerves: II: Discs flat bilaterally; Blinks to bilateral confrontation.  Pupils equal, round, reactive to light and accommodation III,IV, VI: ptosis not present, extra-ocular motions intact bilaterally V,VII: decreased right NLF, facial light touch sensation normal bilaterally VIII: hearing normal bilaterally IX,X: gag reflex present XI: bilateral shoulder shrug XII: midline tongue extension Motor: Lifts all extremities against gravity to command.  Appears to use the LUE preferentially.   Sensory: Responds to light touch throughout Deep Tendon Reflexes: 2+ in the upper extremities and absent in the lower extremities Plantars: Right: mute   Left: mute Cerebellar: Unable to follow commands enough to evaluate.    Lab Results: Basic Metabolic Panel:  Recent Labs Lab 04/04/16 1541 04/05/16 0421  NA 137 139  K 3.7 3.8  CL 105 106  CO2 28 27  GLUCOSE 139* 104*  BUN 20 22*  CREATININE 0.81 0.70  CALCIUM 9.1 9.1    Liver Function Tests:  Recent Labs Lab 04/04/16 1541  AST  21  ALT 9*  ALKPHOS 59  BILITOT 0.9  PROT 7.2  ALBUMIN 4.2   No results for input(s): LIPASE, AMYLASE in the last 168 hours. No results for input(s): AMMONIA in the last 168 hours.  CBC:  Recent Labs Lab 04/04/16 1541 04/05/16 0421  WBC 5.7 5.2  NEUTROABS 3.5  --   HGB 13.0 12.7  HCT 37.6 36.4  MCV 89.2 89.5  PLT 218 187    Cardiac Enzymes:  Recent Labs Lab 04/04/16 1541  TROPONINI <0.03    Lipid Panel:  Recent Labs Lab 04/04/16 1541  CHOL 229*  TRIG 85  HDL 57  CHOLHDL 4.0  VLDL 17  LDLCALC 161*    CBG:  Recent Labs Lab 04/04/16 1542 04/04/16 2042 04/05/16 0240  GLUCAP 126* 85 93    Microbiology: Results for orders placed or performed during the hospital encounter of 04/04/16  Urine culture     Status: None   Collection Time: 04/05/16  2:59 AM  Result Value Ref Range Status   Specimen Description URINE, RANDOM  Final   Special Requests NONE  Final   Culture NO GROWTH Performed at [facility]   Final   Report Status 04/06/2016 FINAL  Final    Coagulation Studies:  Recent Labs  04/04/16 1541  LABPROT 13.5  INR 1.03    Imaging: Ct Head Wo Contrast  Result Date: 04/04/2016 CLINICAL DATA:  Patient is with right-sided neglect. History of dementia. EXAM: CT HEAD WITHOUT CONTRAST TECHNIQUE: Contiguous  axial images were obtained from the base of the skull through the vertex without intravenous contrast. COMPARISON:  None. FINDINGS: Brain: Ventricles and sulci are prominent compatible with atrophy. Periventricular and subcortical white matter hypodensity compatible with chronic microvascular ischemic changes. No evidence for acute cortically based infarct, intracranial hemorrhage, mass lesion or mass-effect. Vascular: No hyperdense vessel or unexpected calcification. Skull: Normal. Negative for fracture or focal lesion. Sinuses/Orbits: No acute finding. Other: None. IMPRESSION: No acute intracranial process. Chronic microvascular ischemic  changes and cortical atrophy. Electronically Signed   By: Annia Beltrew  Davis M.D.   On: 04/04/2016 16:05   [place] Carotid Bilateral  Result Date: 04/05/2016 CLINICAL DATA:  CVA.  Altered mental status. EXAM: BILATERAL CAROTID DUPLEX ULTRASOUND TECHNIQUE: Wallace CullensGray scale imaging, color Doppler and duplex ultrasound were performed of bilateral carotid and vertebral arteries in the neck. COMPARISON:  Head CT 04/04/2016 FINDINGS: Criteria: Quantification of carotid stenosis is based on velocity parameters that correlate the residual internal carotid diameter with NASCET-based stenosis levels, using the diameter of the distal internal carotid lumen as the denominator for stenosis measurement. The following velocity measurements were obtained: RIGHT ICA:  65 cm/sec CCA:  72 cm/sec SYSTOLIC ICA/CCA RATIO:  0.9 DIASTOLIC ICA/CCA RATIO:  0.8 ECA:  85 cm/sec LEFT ICA:  47 cm/sec CCA:  65 cm/sec SYSTOLIC ICA/CCA RATIO:  0.7 DIASTOLIC ICA/CCA RATIO:  1.0 ECA:  73 cm/sec RIGHT CAROTID ARTERY: Right common carotid artery within normal. Mild-to-moderate soft plaque at the right carotid bifurcation and proximal internal carotid artery. Doppler waveforms within normal. RIGHT VERTEBRAL ARTERY:  Normal antegrade flow. LEFT CAROTID ARTERY: Minimal intimal thickening of the common carotid artery. Mild soft atherosclerotic plaque at the carotid bifurcation with mild calcified plaque over the proximal internal carotid artery. Normal Doppler waveforms. LEFT VERTEBRAL ARTERY:  Normal antegrade flow. IMPRESSION: Mild atherosclerotic plaque at the carotid bifurcations and proximal internal carotid arteries bilaterally. No hemodynamically significant stenosis. Normal anterior flow within the vertebral arteries. Electronically Signed   By: Elberta Fortisaniel  Boyle M.D.   On: 04/05/2016 14:24    Medications:  I have reviewed the patient's current medications. Scheduled: . cefTRIAXone (ROCEPHIN)  IV  1 g Intravenous Q24H  . heparin  5,000 Units Subcutaneous Q8H   . LORazepam  2 mg Intravenous Once  . sodium chloride flush  3 mL Intravenous Q12H    Assessment/Plan: 75 year old female with new onset speech deficits.  Has been unable to cooperate for MRI.  Left frontal infarct suspected.  CT personally reviewed and shows no acute changes.  Patient with new onset atrial fibrillation.  Previous to this admission had no history of falls.  Daughter wants to take patient back home.  Physical therapy yet to evaluate.  Carotid dopplers show no evidence of hemodynamically significant stenosis.  Echocardiogram shows no cardiac source of emboli with an EF of 60%.  A1c 5.3, LDL 155.   Recommendations: 1.  Diet now changed.  To restart ASA 325MG  orally 2.  After PT evaluation will determine fall risk and appropriateness of starting anticoagulation 3.  To attempt MRI of the brain again with sedation.  4.  Statin for lipid management with target LDL<70.     LOS: 2 days   Thana FarrLeslie Tarra Pence, MD Neurology [phone] 04/06/2016  1:36 PM

## 2016-04-06 NOTE — Progress Notes (Signed)
PT Cancellation Note  Patient Details Name: Sara Lowe MRN: [id number] DOB: 09-Oct-1940   Cancelled Treatment:    Reason Eval/Treat Not Completed: Medical issues which prohibited therapy. Chart reviewed and RN consulted. Attempted to see patient. Able to obtain history from daughter who states that pt lives with her and ambulates around the house without an assistive device. Most of her communication is garbled and pt with baseline dementia. Attempted to get pt up to EOB but she is very lethargic at this time. RN administered ativan 10 minutes prior to prepare pt for MRI. Pt is too lethargic to participate with PT evaluation at this time. Daughter reports that since admission pt has been able to stand and walk. Daughter would like to take patient home. She is a CNA and feels comfortable caring for patient. Within 10 minutes of attempt pt is transported down to MRI. Will attempt PT evaluation on later date/time as pt is appropriate.   Sharalyn InkJason D Huprich PT, DPT   Huprich,Jason 04/06/2016, 3:47 PM

## 2016-04-06 NOTE — Progress Notes (Signed)
OT Cancellation Note  Patient Details Name: Sara Lowe MRN: [id number] DOB: Apr 08, 1941   Cancelled Treatment:    Reason Eval/Treat Not Completed: Patient at procedure or test/ unavailable.  Order received and chart reviewed.  Pt not in room and at MRI.  Will attempt eval again tomorrow.  Susanne BordersSusan Shariff Lasky, OTR/L ascom [phone] 04/06/16, 3:55 PM

## 2016-04-06 NOTE — Evaluation (Signed)
Clinical/Bedside Swallow Evaluation Patient Details  Name: NALAYA WOJDYLA MRN: [id number] Date of Birth: 1941/03/25  Today's Date: 04/06/2016 Time: SLP Start Time (ACUTE ONLY): 1000 SLP Stop Time (ACUTE ONLY): 1100 SLP Time Calculation (min) (ACUTE ONLY): 60 min  Past Medical History:  Past Medical History:  Diagnosis Date  . Dementia   . Dementia    Past Surgical History: History reviewed. No pertinent surgical history. HPI:   Pt is a 75 y.o. female with a known history of dementia, and no other medical issues- lives with her daughter- Who is a Lawyer and home health care provider. Today morning (04/04/16)- when pt woke up around 11 am- daughter found some weakness on right side of body- and she dropped cup of coffee. Normally pt is able to eat on her own, and walks with minimal support on climbing stairs. Also noted some facial weakness on right side today. Baseline is - no talking- just some mumbling of words, so not able to appreciate today. Nurse in ER noted- not safe to swallow. History obtained from daughter and grand daughters.  Due to [facility] Dementia, her daughter assists with many ADLs   Assessment / Plan / Recommendation Clinical Impression  Pt appeared to adequately tolerate PO trials of ice chips, thin liquids, purees, and solids with no immediate overt s/s of aspiration; no change in vocal quality or respiratory status during PO trials. Oral phase within functional limits for bolus management and oral clearing. Pt required moderate verbal and tactile cues to maintain attention to PO trials. Pt's responsiveness vastly improved when Daughter fed her in comparison to ST services feeding. ST rearranged Pt room to allow ample opportunity to redirect Pt attention to the right side of room, to protect against a chance of right side neglect. Recommend a Dysphagia 3 diet with thin liquids and moderate verbal and tactile cues in order to ensure safe swallowing. Aspiration precautions  were reviewed with Nursing and Daughter. ST services will follow up while admitted for toleration of diet and education as needed. During Eval Pt exhibited mumbled speech with poor intelligibility, which daughter reports is Pt's baseline at home most of the time.     Aspiration Risk   (Reduced Following Aspiration Precautions)    Diet Recommendation Dysphagia 3 (Mech soft);Thin liquid, Aspiration Precautions  Liquid Administration via: Cup;Straw Medication Administration: Crushed with puree Supervision: Full supervision/cueing for compensatory strategies;Staff to assist with self feeding Compensations: Minimize environmental distractions;Slow rate;Small sips/bites;Follow solids with liquid Postural Changes: Seated upright at 90 degrees;Remain upright for at least 30 minutes after po intake    Other  Recommendations Recommended Consults:  (Dietician Follow Up) Oral Care Recommendations: Oral care BID;Staff/trained caregiver to provide oral care   Follow up Recommendations None (TBD)      Frequency and Duration min 2x/week  2 weeks       Prognosis Prognosis for Safe Diet Advancement: Good Barriers to Reach Goals: Cognitive deficits      Swallow Study   General Date of Onset: 04/04/16 HPI:  Pt is a 75 y.o. female with a known history of dementia, and no other medical issues- lives with her daughter- Who is a Lawyer and home health care provider. Today morning (04/04/16)- when pt woke up around 11 am- daughter found some weakness on right side of body- and she dropped cup of coffee. Normally pt is able to eat on her own, and walks with minimal support on climbing stairs. Also noted some facial weakness on right side today. Baseline  is - no talking- just some mumbling of words, so not able to appreciate today. Nurse in ER noted- not safe to swallow. History obtained from daughter and grand daughters.  Due to Moderate-Severe Dementia, her daughter assists with many ADLs Type of Study: Bedside  Swallow Evaluation Previous Swallow Assessment: None indicated Diet Prior to this Study: Regular;Thin liquids (Daughter Assisted) Temperature Spikes Noted: No (WBC - 5.2) Respiratory Status: Room air History of Recent Intubation: No Behavior/Cognition: Cooperative;Pleasant mood;Distractible;Requires cueing;Confused Oral Cavity Assessment: Within Functional Limits Oral Care Completed by SLP: Yes Oral Cavity - Dentition: Dentures, top;Dentures, bottom Vision: Functional for self-feeding Self-Feeding Abilities: Needs assist;Needs set up Patient Positioning: Upright in bed Baseline Vocal Quality: Low vocal intensity (Pt mumbled on occasion, did not talk ) Volitional Cough: Strong Volitional Swallow: Able to elicit    Oral/Motor/Sensory Function Overall Oral Motor/Sensory Function: Mild impairment Facial ROM: Reduced right (Slight) Facial Symmetry: Abnormal symmetry right (Slight) Facial Strength: Within Functional Limits Facial Sensation:  (NT) Lingual ROM: Within Functional Limits Lingual Symmetry: Within Functional Limits Lingual Strength: Within Functional Limits Lingual Sensation:  (NT) Velum:  (NT) Mandible: Within Functional Limits   Ice Chips Ice chips: Within functional limits Presentation: Spoon (3 trials)   Thin Liquid Thin Liquid: Within functional limits Presentation: Cup;Straw (5 trials)    Nectar Thick Nectar Thick Liquid: Not tested   Honey Thick Honey Thick Liquid: Not tested   Puree Puree: Within functional limits Presentation: Spoon (6 trials)   Solid   GO   Solid: Within functional limits Presentation: Spoon (4 trials - softened graham cracker)        Altamese DillingLauren Muller B.A. Clinical Graduate Student 04/06/2016,2:15 PM  This information has been reviewed and agreed upon by this supervising clinician.  Jerilynn SomKatherine Javaria Knapke, MS, CCC-SLP

## 2016-04-06 NOTE — Progress Notes (Signed)
[employer] [facility] [facility] [place] [facility]   PATIENT NAME: Sara BradfordJerri Lowe    MR#:  [id number]  DATE OF BIRTH:  04/17/1941  SUBJECTIVE:   Patient had new onset a fib and on PRN metoprolol. MRi could not be done yesterday She was moving around too much  REVIEW OF SYSTEMS:    Review of Systems  Unable to perform ROS: Mental status change    Tolerating Diet:Nothing by mouth      DRUG ALLERGIES:  No Known Allergies  VITALS:  Blood pressure (!) 105/58, pulse (!) 132, temperature 98 F (36.7 C), temperature source Oral, resp. rate 18, height 5\' 5"  (1.651 m), weight 49 kg (108 lb 1.6 oz), SpO2 97 %.  PHYSICAL EXAMINATION:   Physical Exam  Constitutional: She is well-developed, well-nourished, and in no distress. No distress.  HENT:  Head: Normocephalic.  Eyes: No scleral icterus.  Neck: Normal range of motion. Neck supple. No JVD present. No tracheal deviation present.  Cardiovascular: Normal rate, regular rhythm and normal heart sounds.  Exam reveals no gallop and no friction rub.   No murmur heard. Pulmonary/Chest: Effort normal and breath sounds normal. No respiratory distress. She has no wheezes. She has no rales. She exhibits no tenderness.  Abdominal: Soft. Bowel sounds are normal. She exhibits no distension and no mass. There is no tenderness. There is no rebound and no guarding.  Musculoskeletal: Normal range of motion. She exhibits no edema.  Neurological: She is alert.  Right facial neglect and right facial droop Not much change since yesterday  Does not follow commands well however she is alert   Skin: Skin is warm. No rash noted. No erythema.  Psychiatric:  Patient not speaking      LABORATORY PANEL:   CBC  Recent Labs Lab 04/05/16 0421  WBC 5.2  HGB 12.7  HCT 36.4  PLT 187   ------------------------------------------------------------------------------------------------------------------  Chemistries   Recent Labs Lab  04/04/16 1541 04/05/16 0421  NA 137 139  K 3.7 3.8  CL 105 106  CO2 28 27  GLUCOSE 139* 104*  BUN 20 22*  CREATININE 0.81 0.70  CALCIUM 9.1 9.1  AST 21  --   ALT 9*  --   ALKPHOS 59  --   BILITOT 0.9  --    ------------------------------------------------------------------------------------------------------------------  Cardiac Enzymes  Recent Labs Lab 04/04/16 1541  TROPONINI <0.03   ------------------------------------------------------------------------------------------------------------------  RADIOLOGY:  Ct Head Wo Contrast  Result Date: 04/04/2016 CLINICAL DATA:  Patient is with right-sided neglect. History of dementia. EXAM: CT HEAD WITHOUT CONTRAST TECHNIQUE: Contiguous axial images were obtained from the base of the skull through the vertex without intravenous contrast. COMPARISON:  None. FINDINGS: Brain: Ventricles and sulci are prominent compatible with atrophy. Periventricular and subcortical white matter hypodensity compatible with chronic microvascular ischemic changes. No evidence for acute cortically based infarct, intracranial hemorrhage, mass lesion or mass-effect. Vascular: No hyperdense vessel or unexpected calcification. Skull: Normal. Negative for fracture or focal lesion. Sinuses/Orbits: No acute finding. Other: None. IMPRESSION: No acute intracranial process. Chronic microvascular ischemic changes and cortical atrophy. Electronically Signed   By: Annia Beltrew  Davis M.D.   On: 04/04/2016 16:05   [place] Carotid Bilateral  Result Date: 04/05/2016 CLINICAL DATA:  CVA.  Altered mental status. EXAM: BILATERAL CAROTID DUPLEX ULTRASOUND TECHNIQUE: Wallace CullensGray scale imaging, color Doppler and duplex ultrasound were performed of bilateral carotid and vertebral arteries in the neck. COMPARISON:  Head CT 04/04/2016 FINDINGS: Criteria: Quantification of carotid stenosis is based on velocity parameters that  correlate the residual internal carotid diameter with NASCET-based stenosis  levels, using the diameter of the distal internal carotid lumen as the denominator for stenosis measurement. The following velocity measurements were obtained: RIGHT ICA:  65 cm/sec CCA:  72 cm/sec SYSTOLIC ICA/CCA RATIO:  0.9 DIASTOLIC ICA/CCA RATIO:  0.8 ECA:  85 cm/sec LEFT ICA:  47 cm/sec CCA:  65 cm/sec SYSTOLIC ICA/CCA RATIO:  0.7 DIASTOLIC ICA/CCA RATIO:  1.0 ECA:  73 cm/sec RIGHT CAROTID ARTERY: Right common carotid artery within normal. Mild-to-moderate soft plaque at the right carotid bifurcation and proximal internal carotid artery. Doppler waveforms within normal. RIGHT VERTEBRAL ARTERY:  Normal antegrade flow. LEFT CAROTID ARTERY: Minimal intimal thickening of the common carotid artery. Mild soft atherosclerotic plaque at the carotid bifurcation with mild calcified plaque over the proximal internal carotid artery. Normal Doppler waveforms. LEFT VERTEBRAL ARTERY:  Normal antegrade flow. IMPRESSION: Mild atherosclerotic plaque at the carotid bifurcations and proximal internal carotid arteries bilaterally. No hemodynamically significant stenosis. Normal anterior flow within the vertebral arteries. Electronically Signed   By: Elberta Fortis M.D.   On: 04/05/2016 14:24     ASSESSMENT AND PLAN:   75 year old female with dementia who presents with right-sided neglect and right-sided weakness with right facial droop   1.Acute CVA, likely due to atrial fibrillation: Patient clinically appears to have a left hemispheric/Frontal CVA.  MRI planned for today with Ativan prior to MRI.  Continue aspirin PR.  Speech consultation pending as she did not pass her swallow evaluation in ED.  Patient needs PT and OT consult. Patient will need clinical social worker at discharge. Carotid Doppler without hemodynamically significant stenosis. Echocardiogram result pending   2. Urinary tract infection: Continue Rocephin  3. Dementia: Watch out for delirium.   Management plans discussed with the patient's  daughter and she is in agreement.  CODE STATUS: DO NOT RESUSCITATE TOTAL TIME TAKING CARE OF THIS PATIENT: 30 minutes.   Rounded with nursing  POSSIBLE D/C 1-2 days, DEPENDING ON CLINICAL CONDITION.   Genie Wenke M.D on 04/06/2016 at 10:53 AM  Between 7am to 6pm - Pager - [phone] After 6pm go to [email or web address] - Social research officer, government  [facility]  Office  [phone]  CC: Primary care physician; No primary care provider on file.  Note: This dictation was prepared with Dragon dictation along with smaller phrase technology. Any transcriptional errors that result from this process are unintentional.

## 2016-04-06 NOTE — Plan of Care (Signed)
Problem: SLP Dysphagia Goals Goal: Misc Dysphagia Goal Pt will safely tolerate po diet of least restrictive consistency w/ no overt s/s of aspiration noted by Staff/pt/family x3 sessions.    

## 2016-04-06 NOTE — Progress Notes (Signed)
HR A fib on monitor increase to 130s-140s this morning at 0900. PRN Metoprolol given with noted effect. Dr. Juliene PinaMody aware.  HR A fib on monitor throughout the day. Increased again at 1715 due to agitation. Patient comforted and repositioned and HR decreased but continues in A fib.   Will continue to monitor closely.

## 2016-04-06 NOTE — Progress Notes (Signed)
Pt still sleeping/sedated from ativan for MRI this afternoon.  Unable to do NIH stroke assessment.  Unable to give oral meds.  Pt did not eat dinner this evening.  VSS. Dtr at bedside. Henriette CombsSarah Shonta Bourque RN

## 2016-04-06 NOTE — Care Management Important Message (Signed)
Important Message  Patient Details  Name: Sara Lowe MRN: [id number] Date of Birth: Mar 18, 1941   Medicare Important Message Given:  Yes    Gwenette GreetBrenda S Annitta Fifield, RN 04/06/2016, 8:18 AM

## 2016-04-07 LAB — BASIC METABOLIC PANEL
Anion gap: 5 (ref 5–15)
BUN: 14 mg/dL (ref 6–20)
CHLORIDE: 107 mmol/L (ref 101–111)
CO2: 27 mmol/L (ref 22–32)
Calcium: 9.1 mg/dL (ref 8.9–10.3)
Creatinine, Ser: 0.82 mg/dL (ref 0.44–1.00)
GFR calc Af Amer: >60 mL/min (ref >60)
GFR calc non Af Amer: >60 mL/min (ref >60)
Glucose, Bld: 95 mg/dL (ref 65–99)
POTASSIUM: 3.8 mmol/L (ref 3.5–5.1)
SODIUM: 139 mmol/L (ref 135–145)

## 2016-04-07 LAB — CBC
HCT: 38.1 % (ref 35.0–47.0)
Hemoglobin: 13.3 g/dL (ref 12.0–16.0)
MCH: 31.1 pg (ref 26.0–34.0)
MCHC: 35 g/dL (ref 32.0–36.0)
MCV: 88.9 fL (ref 80.0–100.0)
PLATELETS: 172 10*3/uL (ref 150–440)
RBC: 4.28 MIL/uL (ref 3.80–5.20)
RDW: 12.2 % (ref 11.5–14.5)
WBC: 4.8 10*3/uL (ref 3.6–11.0)

## 2016-04-07 MED ORDER — DIGOXIN 62.5 MCG PO TABS: 0.0625 mg | ORAL_TABLET | Freq: Every day | ORAL | 0 refills | Status: AC

## 2016-04-07 MED ORDER — APIXABAN 5 MG PO TABS: 5.0000 mg | ORAL_TABLET | Freq: Two times a day (BID) | ORAL | 0 refills | Status: DC

## 2016-04-07 MED ORDER — ASPIRIN 81 MG PO CHEW: 81.0000 mg | CHEWABLE_TABLET | Freq: Every day | ORAL | 0 refills | Status: DC

## 2016-04-07 MED ORDER — SODIUM CHLORIDE 0.9 % IV BOLUS (SEPSIS)
500.0000 mL | Freq: Once | INTRAVENOUS | Status: AC
  Administered 2016-04-07: 06:00:00 500 mL via INTRAVENOUS

## 2016-04-07 MED ORDER — ROSUVASTATIN CALCIUM 10 MG PO TABS: 10.0000 mg | ORAL_TABLET | Freq: Every day | ORAL | 0 refills | Status: AC

## 2016-04-07 MED ORDER — DIGOXIN 125 MCG PO TABS
0.0625 mg | ORAL_TABLET | Freq: Every day | ORAL | Status: DC
  Administered 2016-04-07: 0.0625 mg via ORAL
  Filled 2016-04-07: qty 1

## 2016-04-07 MED ORDER — APIXABAN 5 MG PO TABS
5.0000 mg | ORAL_TABLET | Freq: Two times a day (BID) | ORAL | Status: DC
  Administered 2016-04-07: 5 mg via ORAL
  Filled 2016-04-07: qty 1

## 2016-04-07 NOTE — Evaluation (Addendum)
Occupational Therapy Evaluation Patient Details Name: Sara Lowe MRN: [id number] DOB: [birth year] Today's Date: 04/07/2016    History of Present Illness Pt. is a 75 y.o. female who was admitted with a CVA, and a history of Dementia.   Clinical Impression   Pt. Is a 75 y.o. Female who was admitted to [facility] with a CVA, and Dementia. Pt. Presents with cognitive impairments, weakness, and decreased functional mobility which hinder her ability to complete basic ADL tasks. Pt. Could benefit from OT services for ADL training, functional mobility, and pt./family education about UE functioning, and positioning. Pt. Could benefit from SNF level of care.    Follow Up Recommendations  SNF    Equipment Recommendations       Recommendations for Other Services       Precautions / Restrictions Restrictions Weight Bearing Restrictions: No                         Transfers    Pt. Lethargic.                                                              ADL Overall ADL's : Needs assistance/impaired Eating/Feeding: Maximal assistance   Grooming: Maximal assistance               Lower Body Dressing: Total assistance                 General ADL Comments: Pt. education was provided about positioing, and UE light ADL care.     Vision     Perception     Praxis      Pertinent Vitals/Pain Pain Assessment: 0-10 Pain Score: 0-No pain     Hand Dominance Right   Extremity/Trunk Assessment Upper Extremity Assessment Upper Extremity Assessment: Pt. Presents with LUE weakness           Communication     Cognition Arousal/Alertness: Lethargic Behavior During Therapy: WFL for tasks assessed/performed Overall Cognitive Status: History of cognitive impairments - at baseline                     General Comments       Exercises       Shoulder Instructions      Home Living Family/patient expects to be discharged  to:: Private residence Living Arrangements: Children Available Help at Discharge: Family Type of Home: House Home Access: Ramped entrance     Home Layout: One level     Bathroom Shower/Tub: Tub/shower unit Shower/tub characteristics: Engineer, building services: Standard Bathroom Accessibility: Yes   Home Equipment: Wheelchair - manual          Prior Functioning/Environment Level of Independence: Independent with assistive device(s)                 OT Problem List: Decreased strength;Pain;Decreased activity tolerance;Decreased safety awareness;Impaired UE functional use   OT Treatment/Interventions: Self-care/ADL training;Therapeutic exercise;Therapeutic activities;Patient/family education    OT Goals(Current goals can be found in the care plan section) Acute Rehab OT Goals Patient Stated Goal: To get better. OT Goal Formulation: With patient Potential to Achieve Goals: Fair ADL Goals Pt Will Perform Eating: with modified independence Pt Will Perform Grooming: with modified independence  OT Frequency: Min 1X/week   Barriers  to D/C:            Co-evaluation              End of Session    Activity Tolerance: Patient tolerated treatment well Patient left: in bed;with call bell/phone within reach;with bed alarm set;with family/visitor present   Time: 0920-0950 OT Time Calculation (min): 30 min Charges:  OT General Charges $OT Visit: 1 Procedure OT Evaluation $OT Eval Moderate Complexity: 1 Procedure G-Codes:    Olegario MessierElaine Asher Torpey, MS, OTR/L 04/07/2016, 11:07 AM

## 2016-04-07 NOTE — Progress Notes (Signed)
Subjective: Patient able to walk with therapy today requiring min to mod assist.  Family feels they will be able to provide adequate supervision.    Objective: Current vital signs: BP 105/65 (BP Location: Right Arm)   Pulse 92   Temp (!) 96.4 F (35.8 C) (Axillary)   Resp 18   Ht 5\' 5"  (1.651 m)   Wt 49 kg (108 lb 1.6 oz)   SpO2 97%   BMI 17.99 kg/m  Vital signs in last 24 hours: Temp:  [96.4 F (35.8 C)-98 F (36.7 C)] 96.4 F (35.8 C) (09/19 0524) Pulse Rate:  [71-140] 92 (09/19 0820) Resp:  [16-20] 18 (09/19 0820) BP: (65-107)/(46-65) 105/65 (09/19 0820) SpO2:  [94 %-99 %] 97 % (09/19 0820)  Intake/Output from previous day: 09/18 0701 - 09/19 0700 In: 650 [I.V.:600; IV Piggyback:50] Out: -  Intake/Output this shift: No intake/output data recorded. Nutritional status: DIET DYS 3 Room service appropriate? No; Fluid consistency: Thin Diet - low sodium heart healthy  Neurologic Exam: Mental Status: Alert and awake.  Speech slurred and only mumbling noted.  Able to follow simple commands. Some evidence of right neglect noted.   Cranial Nerves: II: Discs flat bilaterally; Blinks to bilateral confrontation.  Pupils equal, round, reactive to light and accommodation III,IV, VI: ptosis not present, extra-ocular motions intact bilaterally V,VII: decreased right NLF, facial light touch sensation normal bilaterally VIII: hearing normal bilaterally IX,X: gag reflex present XI: bilateral shoulder shrug XII: midline tongue extension Motor: Lifts all extremities against gravity to command.  Appears to use the LUE preferentially.   Sensory: Responds to light touch throughout Deep Tendon Reflexes: 2+ in the upper extremities and absent in the lower extremities Plantars: Right: mute                              Left: mute  Lab Results: Basic Metabolic Panel:  Recent Labs Lab 04/04/16 1541 04/05/16 0421 04/07/16 0513  NA 137 139 139  K 3.7 3.8 3.8  CL 105 106 107  CO2 28  27 27   GLUCOSE 139* 104* 95  BUN 20 22* 14  CREATININE 0.81 0.70 0.82  CALCIUM 9.1 9.1 9.1    Liver Function Tests:  Recent Labs Lab 04/04/16 1541  AST 21  ALT 9*  ALKPHOS 59  BILITOT 0.9  PROT 7.2  ALBUMIN 4.2   No results for input(s): LIPASE, AMYLASE in the last 168 hours. No results for input(s): AMMONIA in the last 168 hours.  CBC:  Recent Labs Lab 04/04/16 1541 04/05/16 0421 04/07/16 0513  WBC 5.7 5.2 4.8  NEUTROABS 3.5  --   --   HGB 13.0 12.7 13.3  HCT 37.6 36.4 38.1  MCV 89.2 89.5 88.9  PLT 218 187 172    Cardiac Enzymes:  Recent Labs Lab 04/04/16 1541  TROPONINI <0.03    Lipid Panel:  Recent Labs Lab 04/04/16 1541  CHOL 229*  TRIG 85  HDL 57  CHOLHDL 4.0  VLDL 17  LDLCALC 573155*    CBG:  Recent Labs Lab 04/04/16 1542 04/04/16 2042 04/05/16 0240  GLUCAP 126* 85 93    Microbiology: Results for orders placed or performed during the hospital encounter of 04/04/16  Urine culture     Status: None   Collection Time: 04/05/16  2:59 AM  Result Value Ref Range Status   Specimen Description URINE, RANDOM  Final   Special Requests NONE  Final   Culture  NO GROWTH Performed at [facility]   Final   Report Status 04/06/2016 FINAL  Final    Coagulation Studies:  Recent Labs  04/04/16 1541  LABPROT 13.5  INR 1.03    Imaging: Mr Brain Wo Contrast  Result Date: 04/06/2016 CLINICAL DATA:  Dementia.  Speech disturbance.  Symptoms worsening. EXAM: MRI HEAD WITHOUT CONTRAST TECHNIQUE: Multiplanar, multiecho pulse sequences of the brain and surrounding structures were obtained without intravenous contrast. COMPARISON:  CT 04/04/2016 FINDINGS: Brain: The examination suffers from motion degradation. There is acute infarction in the left middle cerebral artery territory affecting the deep insula and parietal lobe. The region of involvement measures approximately 5 cm. No significant swelling or mass effect. No sign of hemorrhagic  transformation. Brainstem and cerebellum are unremarkable. Cerebral hemispheres show generalized atrophy with chronic small-vessel ischemic change of the deep white matter. No hydrocephalus. No extra-axial fluid collection. Vascular: Major vessels at the base of the brain show flow. Skull and upper cervical spine: No abnormality seen. Sinuses/Orbits: Clear Other: None significant. IMPRESSION: Acute infarction within the left middle cerebral artery affecting the deep insula and parietal lobe. Region of infarction measures about 5 cm. No significant swelling, mass effect or hemorrhagic transformation. Electronically Signed   By: Paulina Fusi M.D.   On: 04/06/2016 16:22   [place] Carotid Bilateral  Result Date: 04/05/2016 CLINICAL DATA:  CVA.  Altered mental status. EXAM: BILATERAL CAROTID DUPLEX ULTRASOUND TECHNIQUE: Wallace Cullens scale imaging, color Doppler and duplex ultrasound were performed of bilateral carotid and vertebral arteries in the neck. COMPARISON:  Head CT 04/04/2016 FINDINGS: Criteria: Quantification of carotid stenosis is based on velocity parameters that correlate the residual internal carotid diameter with NASCET-based stenosis levels, using the diameter of the distal internal carotid lumen as the denominator for stenosis measurement. The following velocity measurements were obtained: RIGHT ICA:  65 cm/sec CCA:  72 cm/sec SYSTOLIC ICA/CCA RATIO:  0.9 DIASTOLIC ICA/CCA RATIO:  0.8 ECA:  85 cm/sec LEFT ICA:  47 cm/sec CCA:  65 cm/sec SYSTOLIC ICA/CCA RATIO:  0.7 DIASTOLIC ICA/CCA RATIO:  1.0 ECA:  73 cm/sec RIGHT CAROTID ARTERY: Right common carotid artery within normal. Mild-to-moderate soft plaque at the right carotid bifurcation and proximal internal carotid artery. Doppler waveforms within normal. RIGHT VERTEBRAL ARTERY:  Normal antegrade flow. LEFT CAROTID ARTERY: Minimal intimal thickening of the common carotid artery. Mild soft atherosclerotic plaque at the carotid bifurcation with mild calcified plaque  over the proximal internal carotid artery. Normal Doppler waveforms. LEFT VERTEBRAL ARTERY:  Normal antegrade flow. IMPRESSION: Mild atherosclerotic plaque at the carotid bifurcations and proximal internal carotid arteries bilaterally. No hemodynamically significant stenosis. Normal anterior flow within the vertebral arteries. Electronically Signed   By: Elberta Fortis M.D.   On: 04/05/2016 14:24    Medications:  I have reviewed the patient's current medications. Scheduled: . apixaban  5 mg Oral BID  . cefTRIAXone (ROCEPHIN)  IV  1 g Intravenous Q24H  . digoxin  0.0625 mg Oral Daily  . rosuvastatin  10 mg Oral Daily  . sodium chloride flush  3 mL Intravenous Q12H    Assessment/Plan: MRI of the brain personally reviewed and shows an acute left MCA branch infarct.  No evidence of hemorrhage.  Noted yo have new onset atrial fibrillation.  After conversation with family, they feel they can mitigate her fall risk and will therefore start oral anticoagulation.  Recommendations: 1.  Eilquis to be initiated.  D/C ASA. 2.  Agree with continued therapy.  LOS: 3 days   Thana Farr, MD Neurology [phone] 04/07/2016  11:16 AM

## 2016-04-07 NOTE — Discharge Summary (Addendum)
[employer] [facility] [place] [facility]   PATIENT NAME: Sara Lowe    MR#:  [id number]  DATE OF BIRTH:  12-13-1940  DATE OF ADMISSION:  04/04/2016 ADMITTING PHYSICIAN: Altamese Dilling, MD  DATE OF DISCHARGE: 04/07/2016  PRIMARY CARE PHYSICIAN: No primary care provider on file.    ADMISSION DIAGNOSIS:  CVA (cerebral infarction) [I63.9] Cerebral infarction due to unspecified mechanism [I63.9]  DISCHARGE DIAGNOSIS:  Principal Problem:   Stroke [facility]) Active Problems:   CVA (cerebral infarction)   SECONDARY DIAGNOSIS:   Past Medical History:  Diagnosis Date  . Dementia   . 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 She will be discharged with low-dose digoxin for heart rate control. Due to low blood pressure I was unable to discharge patient on diltiazem or metoprolol. Echocardiogram showed normal ejection fraction without cardiac embolic source. Carotid ultrasound showed no hemodynamically significant stenosis. PT and OT are recommending skilled mentioned facility at discharge. 2. Urinary tract infection: She was started on Rocephin. Urine culture negative. And about except now been discontinued. 3. Dementia:     DISCHARGE CONDITIONS AND DIET:   Stable on dysphagia 3 diet mechanical soft thin liquid with aspiration precautions  CONSULTS OBTAINED:  Treatment Team:  Pauletta Browns, MD Marcina Millard, MD  DRUG ALLERGIES:  No Known Allergies  DISCHARGE MEDICATIONS:   Current Discharge Medication List    START taking these medications   Details  apixaban (ELIQUIS) 5 MG TABS tablet Take 1 tablet (5 mg total) by mouth 2  (two) times daily. Qty: 60 tablet, Refills: 0    digoxin 62.5 MCG TABS Take 0.0625 mg by mouth daily. Qty: 30 tablet, Refills: 0    rosuvastatin (CRESTOR) 10 MG tablet Take 1 tablet (10 mg total) by mouth daily. Qty: 30 tablet, Refills: 0      CONTINUE these medications which have NOT CHANGED   Details  cholecalciferol (VITAMIN D) 400 units TABS tablet Take 400 Units by mouth.    Multiple Vitamin (MULTIVITAMIN) capsule Take 1 capsule by mouth daily.              Today   CHIEF COMPLAINT:   Patient had low blood pressures morning which is improved with IV fluids. Family at bedside.  VITAL SIGNS:  Blood pressure 105/65, pulse 92, temperature (!) 96.4 F (35.8 C), temperature source Axillary, resp. rate 18, height 5\' 5"  (1.651 m), weight 49 kg (108 lb 1.6 oz), SpO2 97 %.   REVIEW OF SYSTEMS:  Review of Systems  Unable to perform ROS: Acuity of condition     PHYSICAL EXAMINATION:  GENERAL:  75 y.o.-year-old patient lying in the bed with no acute distress.  With her baby doll next to her NECK:  Supple, no jugular venous distention. No thyroid enlargement, no tenderness.  LUNGS: Normal breath sounds bilaterally, no wheezing, rales,rhonchi  No use of accessory muscles of respiration.  CARDIOVASCULAR: Irregular, irregular No murmurs, rubs, or gallops.  ABDOMEN: Soft, non-tender, non-distended. Bowel sounds present. No organomegaly or mass.  EXTREMITIES: No pedal edema, cyanosis, or clubbing.  PSYCHIATRIC: The patient is alert with garbled speech moves all extremities SKIN: No obvious rash, lesion, or ulcer.   DATA REVIEW:  CBC  Recent Labs Lab 04/07/16 0513  WBC 4.8  HGB 13.3  HCT 38.1  PLT 172    Chemistries   Recent Labs Lab 04/04/16 1541  04/07/16 0513  NA 137  < > 139  K 3.7  < > 3.8  CL 105  < > 107  CO2 28  < > 27  GLUCOSE 139*  < > 95  BUN 20  < > 14  CREATININE 0.81  < > 0.82  CALCIUM 9.1  < > 9.1  AST 21  --   --   ALT 9*  --   --    ALKPHOS 59  --   --   BILITOT 0.9  --   --   < > = values in this interval not displayed.  Cardiac Enzymes  Recent Labs Lab 04/04/16 1541  TROPONINI <0.03    Microbiology Results  @[id number] @  RADIOLOGY:  Mr Brain Wo Contrast  Result Date: 04/06/2016 CLINICAL DATA:  Dementia.  Speech disturbance.  Symptoms worsening. EXAM: MRI HEAD WITHOUT CONTRAST TECHNIQUE: Multiplanar, multiecho pulse sequences of the brain and surrounding structures were obtained without intravenous contrast. COMPARISON:  CT 04/04/2016 FINDINGS: Brain: The examination suffers from motion degradation. There is acute infarction in the left middle cerebral artery territory affecting the deep insula and parietal lobe. The region of involvement measures approximately 5 cm. No significant swelling or mass effect. No sign of hemorrhagic transformation. Brainstem and cerebellum are unremarkable. Cerebral hemispheres show generalized atrophy with chronic small-vessel ischemic change of the deep white matter. No hydrocephalus. No extra-axial fluid collection. Vascular: Major vessels at the base of the brain show flow. Skull and upper cervical spine: No abnormality seen. Sinuses/Orbits: Clear Other: None significant. IMPRESSION: Acute infarction within the left middle cerebral artery affecting the deep insula and parietal lobe. Region of infarction measures about 5 cm. No significant swelling, mass effect or hemorrhagic transformation. Electronically Signed   By: Paulina Fusi M.D.   On: 04/06/2016 16:22   US Carotid Bilateral  Result Date: 04/05/2016 CLINICAL DATA:  CVA.  Altered mental status. EXAM: BILATERAL CAROTID DUPLEX ULTRASOUND TECHNIQUE: Wallace Cullens scale imaging, color Doppler and duplex ultrasound were performed of bilateral carotid and vertebral arteries in the neck. COMPARISON:  Head CT 04/04/2016 FINDINGS: Criteria: Quantification of carotid stenosis is based on velocity parameters that correlate the residual internal  carotid diameter with NASCET-based stenosis levels, using the diameter of the distal internal carotid lumen as the denominator for stenosis measurement. The following velocity measurements were obtained: RIGHT ICA:  65 cm/sec CCA:  72 cm/sec SYSTOLIC ICA/CCA RATIO:  0.9 DIASTOLIC ICA/CCA RATIO:  0.8 ECA:  85 cm/sec LEFT ICA:  47 cm/sec CCA:  65 cm/sec SYSTOLIC ICA/CCA RATIO:  0.7 DIASTOLIC ICA/CCA RATIO:  1.0 ECA:  73 cm/sec RIGHT CAROTID ARTERY: Right common carotid artery within normal. Mild-to-moderate soft plaque at the right carotid bifurcation and proximal internal carotid artery. Doppler waveforms within normal. RIGHT VERTEBRAL ARTERY:  Normal antegrade flow. LEFT CAROTID ARTERY: Minimal intimal thickening of the common carotid artery. Mild soft atherosclerotic plaque at the carotid bifurcation with mild calcified plaque over the proximal internal carotid artery. Normal Doppler waveforms. LEFT VERTEBRAL ARTERY:  Normal antegrade flow. IMPRESSION: Mild atherosclerotic plaque at the carotid bifurcations and proximal internal carotid arteries bilaterally. No hemodynamically significant stenosis. Normal anterior flow within the vertebral arteries. Electronically Signed   By: Elberta Fortis M.D.   On: 04/05/2016 14:24      Management plans discussed with the patient's  daughter and she is in agreement. Stable for discharge   Patient should follow up with neurology and cardiology  CODE STATUS:     Code Status Orders        Start     Ordered   04/06/16 0907  Do not attempt resuscitation (DNR)  Continuous    Question Answer Comment  In the event of cardiac or respiratory ARREST Do not call a "code blue"   In the event of cardiac or respiratory ARREST Do not perform Intubation, CPR, defibrillation or ACLS   In the event of cardiac or respiratory ARREST Use medication by any route, position, wound care, and other measures to relive pain and suffering. May use oxygen, suction and manual treatment of  airway obstruction as needed for comfort.      04/06/16 0906    Code Status History    Date Active Date Inactive Code Status Order ID Comments User Context   04/04/2016  7:44 PM 04/05/2016  9:36 AM Full Code [id number]  Altamese DillingVaibhavkumar Vachhani, MD Inpatient    Advance Directive Documentation   Flowsheet Row Most Recent Value  Type of Advance Directive  Healthcare Power of Attorney, Living will  Pre-existing out of facility DNR order (yellow form or pink MOST form)  No data  "MOST" Form in Place?  No data      TOTAL TIME TAKING CARE OF THIS PATIENT: 35 minutes.    Note: This dictation was prepared with Dragon dictation along with smaller phrase technology. Any transcriptional errors that result from this process are unintentional.  Story Conti M.D on 04/07/2016 at 11:06 AM  Between 7am to 6pm - Pager - [phone] After 6pm go to [email or web address] - Social research officer, governmentpassword [email or web address]  [facility]  Office  [phone]  CC: Primary care physician; No primary care provider on file.

## 2016-04-07 NOTE — Progress Notes (Signed)
Discharge paperwork reviewed with patient's daughter who will be caring for the patient who is disoriented x4. Patient's daughter verbalizes understanding. Refuses home health services at this time. [facility] [place], patient's daughter aware. Patient's daughter to transport home.

## 2016-04-07 NOTE — Care Management (Signed)
Physical therapy evaluation completed. Recommends home with home health and therapy. Spoke with daughter at the bedside. States that she is trying to pursue the cash and counseling program at [facility] [employer].  Declines services in the home at this time. Gwenette GreetBrenda S Leonilda Cozby RN MSN CCM Care Management [phone]

## 2016-04-07 NOTE — Progress Notes (Signed)
Speech Language Pathology Treatment: Dysphagia  Patient Details Name: Sara LimaJerri S Lowe MRN: [id number] DOB: 10-28-1940 Today's Date: 04/07/2016 Time: 1300-1330 SLP Time Calculation (min) (ACUTE ONLY): 30 min  Assessment / Plan / Recommendation Clinical Impression  Pt was eating a cinnamon bun when ST services entered. Pt was feeding self, under the full supervison of her daughter and being given cues to take small bites and swallow. Of note, PT services found right oral pocketing of eggs and grits this morning directly following her breakfast meal. PT services were able to remove pocketed bolus, and Daughter observed. Daughter states PT services interrupted Pt eating breakfast meal, and "she must have gotten distracted with the last bite". There was no pocketing noted when ST services observed Pt eating cinnamon bun this afternoon. Pt was demonstrating appropriate mastication and bolus management. ST services primarily focused on educating Daughter, who is also primary caregiver, on the dangers of oral pocketing and importance of being sure the oral cavity is cleared of remnants during swallows and following each meal/snack. Oral swabs where provided to assist in oral clearing following a meal/snack. Documentation of aspiration precautions and swallowing strategies were provided and Daughter was re-educated on importance in order to ensure safe swallowing. ST will follow up to while admitted to monitor diet and provide education as needed. No further skilled ST services recommended at discharge.    HPI HPI:  Pt is a 75 y.o. female with a known history of dementia, and no other medical issues- lives with her daughter- Who is a LawyerCNA and home health care provider. Today morning (04/04/16)- when pt woke up around 11 am- daughter found some weakness on right side of body- and she dropped cup of coffee. Normally pt is able to eat on her own, and walks with minimal support on climbing stairs. Also noted some facial  weakness on right side today. Baseline is - no talking- just some mumbling of words, so not able to appreciate today. Nurse in ER noted- not safe to swallow. History obtained from daughter and grand daughters.  Due to Moderate-Severe Dementia, her daughter assists with many ADLs      SLP Plan  Continue with current plan of care     Recommendations  Diet recommendations: Dysphagia 3 (mechanical soft);Thin liquid Liquids provided via: Straw;Cup Medication Administration: Crushed with puree Supervision: Full supervision/cueing for compensatory strategies;Patient able to self feed (Set Up Assist) Compensations: Minimize environmental distractions;Slow rate;Small sips/bites;Follow solids with liquid;Lingual sweep for clearance of pocketing Postural Changes and/or Swallow Maneuvers: Seated upright 90 degrees;Upright 30-60 min after meal                Oral Care Recommendations: Oral care BID;Staff/trained caregiver to provide oral care Follow up Recommendations: None Plan: Continue with current plan of care       GO                Altamese DillingLauren Kc Sedlak, B.A. Clinical Graduate Student 04/07/2016, 1:31 PM

## 2016-04-07 NOTE — Evaluation (Signed)
Physical Therapy Evaluation Patient Details Name: Sara Lowe MRN: [id number] DOB: [birth year] Today's Date: 04/07/2016   History of Present Illness  Pt. is 75y.o. female presented to ED with R facial droop, admitted with CVA (L MCA infarct). Hx. of dementia and poor communication skills.   Clinical Impression  Pt. Awake supine in bed upon arrival. Pt. Has history of dementia and limited communication at baseline. Pt. Able to follow simple commands/demonstration intermittently throughout assessment. Pt. Demonstrates preferential use of the L UE for functional tasks and gross L UE strength 3/5 with gross R UE strength 3-/5 unable to perform formal assessment due to cognition but pt. Demonstrates ability to lift B UE against gravity. Pt. Showed decreased sensation R UE distally > proximally relative to intact sensation in L UE She showed decreased attention to the R side during bed mobility and ambulation. LE strength grossly 4/5, formal assessment limited by cognition, R LE sensation decreased relative to LLE. Pt. Able to perform bed mobility with min A. Initiating movement of LE and using bedrail with LUE.  Pt. Performed approx. 200 feet of ambulation with B UE hand hold, demonstrating narrow BOS, scissoring with LLE, lack of attention to the R side and drifting to the R side. Would benefit from skilled PT to address above deficits and promote optimal return to PLOF Recommend 24/7 assist available at all times and follow up with home health PT upon d/c.     Follow Up Recommendations Home health PT;Supervision/Assistance - 24 hour    Equipment Recommendations       Recommendations for Other Services       Precautions / Restrictions Precautions Precautions: Fall Restrictions Weight Bearing Restrictions: No      Mobility  Bed Mobility Overal bed mobility: Needs Assistance Bed Mobility: Supine to Sit     Supine to sit: Min assist     General bed mobility comments: Pt. able to  initiate B LE movement to EOB, HOB raised, use of L UE no bedrail. Pt. required max A to attend to R UE and bring R UE forward to EOB  Transfers Overall transfer level: Needs assistance Equipment used: 1 person hand held assist Transfers: Sit to/from Stand Sit to Stand: Min assist         General transfer comment: Pt. able to initate sit<>stand with verbal and tactile cues.   Ambulation/Gait Ambulation/Gait assistance: Min assist Ambulation Distance (Feet): 200 Feet Assistive device: 2 person hand held assist       General Gait Details: Pt. demonstrates need for B UE support for balance with gait, demonstrates good cadence, narrow BOS, scizzoring with LLE, and inattention/drift to the right.   Stairs            Wheelchair Mobility    Modified Rankin (Stroke Patients Only)       Balance Overall balance assessment: Needs assistance Sitting-balance support: Feet supported;Bilateral upper extremity supported Sitting balance-Leahy Scale: Fair     Standing balance support: Bilateral upper extremity supported (hand hold assist) Standing balance-Leahy Scale: Fair                               Pertinent Vitals/Pain Pain Assessment: No/denies pain Pain Score: 0-No pain    Home Living Family/patient expects to be discharged to:: Private residence Living Arrangements: Children Available Help at Discharge: Family Type of Home: House Home Access: Ramped entrance     Home Layout: One level Home Equipment:  Wheelchair - manual      Prior Function Level of Independence: Independent (Independent with assist of daughter full time )               Hand Dominance   Dominant Hand: Right    Extremity/Trunk Assessment   Upper Extremity Assessment: Generalized weakness;RUE deficits/detail;LUE deficits/detail;Difficult to assess due to impaired cognition RUE Deficits / Details: R UE grossly 3/5, unable to formally assess strength due to cognition, Pt.  demonstrates abilty to raise R UE against gravity and use for functional task. Does demonstrate some moments of inattention to the R side and R UE    RUE Sensation:  (decreased distally, intact proximally) LUE Deficits / Details: L UE grossly 3+/5, unable to formally assess strength due to cognition. Pt. demonstrates ability to raise L UE against gravity and use during functional task. shows preference to use of L UE > R UE   Lower Extremity Assessment: LLE deficits/detail;RLE deficits/detail (Unable to formally assess due to pt. cognition, grossly 4/5, pt.shows abiltiy to move B LE against gravity.)         Communication   Communication: Expressive difficulties (Pt. daughter reports hx. of garbled speech with moments of clarity )  Cognition Arousal/Alertness: Awake/alert Behavior During Therapy: WFL for tasks assessed/performed;Impulsive Overall Cognitive Status: History of cognitive impairments - at baseline                      General Comments      Exercises     Assessment/Plan    PT Assessment Patient needs continued PT services  PT Problem List Decreased strength;Decreased range of motion;Decreased activity tolerance;Decreased balance;Decreased mobility;Decreased coordination;Decreased cognition;Decreased knowledge of use of DME;Decreased safety awareness          PT Treatment Interventions Gait training;Functional mobility training;Therapeutic exercise;Therapeutic activities;Balance training;Neuromuscular re-education;Patient/family education    PT Goals (Current goals can be found in the Care Plan section)  Acute Rehab PT Goals Patient Stated Goal: To get better. PT Goal Formulation: Patient unable to participate in goal setting    Frequency 7X/week   Barriers to discharge        Co-evaluation               End of Session Equipment Utilized During Treatment: Gait belt Activity Tolerance: Patient tolerated treatment well Patient left: in  chair;with call bell/phone within reach;with family/visitor present;with chair alarm set (Pt. daughter and daughter in law present)           Time: 1016-1050 PT Time Calculation (min) (ACUTE ONLY): 34 min   Charges:         PT G CodesHuntley Lowe:        Sara Lowe 04/07/2016, 1:12 PM

## 2017-07-17 IMAGING — US US CAROTID DUPLEX BILAT
1 series · 13 of 24 positions shown · non-contrast
Comparison: Head CT 04/04/2016

CLINICAL DATA: CVA.  Altered mental status.

EXAM:
BILATERAL CAROTID DUPLEX ULTRASOUND
TECHNIQUE: Gray scale imaging, color Doppler and duplex ultrasound were
performed of bilateral carotid and vertebral arteries in the neck.

[Series 1: us carotid duplex bilat · 13 of 62 slices shown]
[im 1/62]
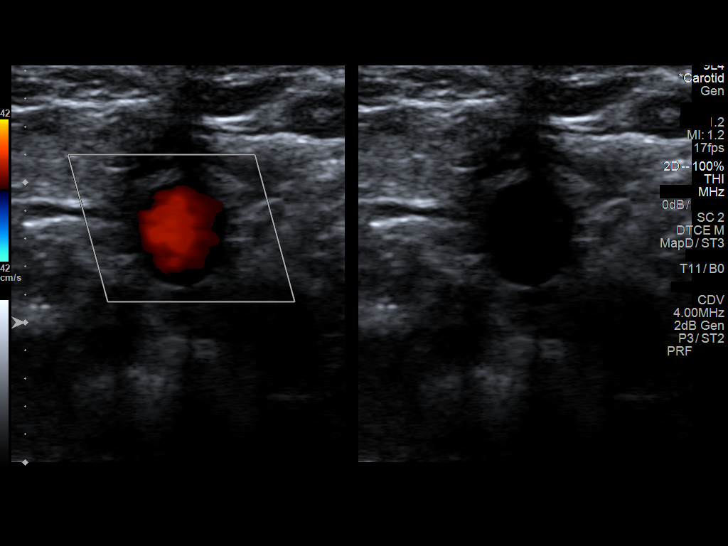
[im 6/62]
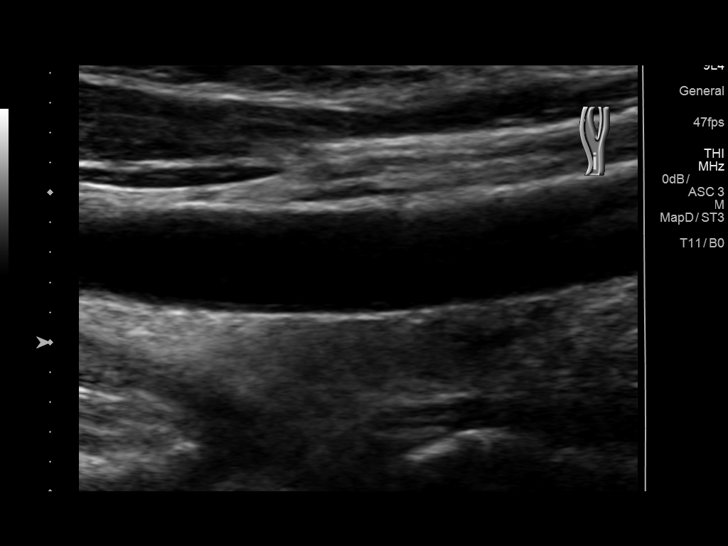
[im 11/62]
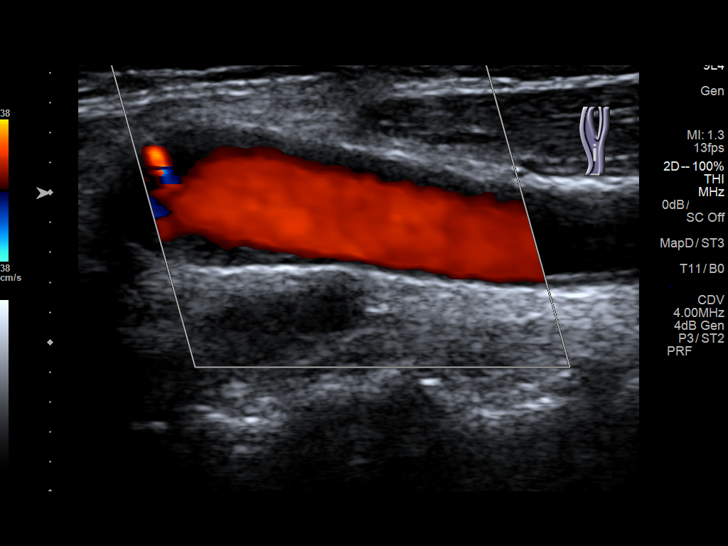
[im 16/62]
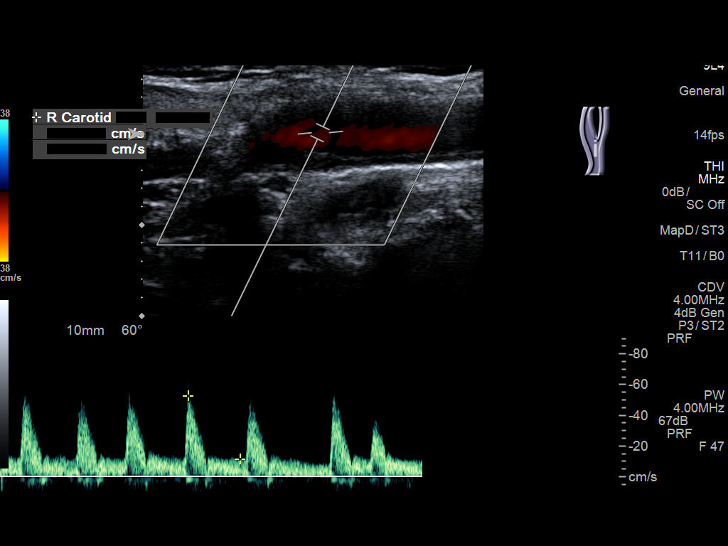
[im 22/62]
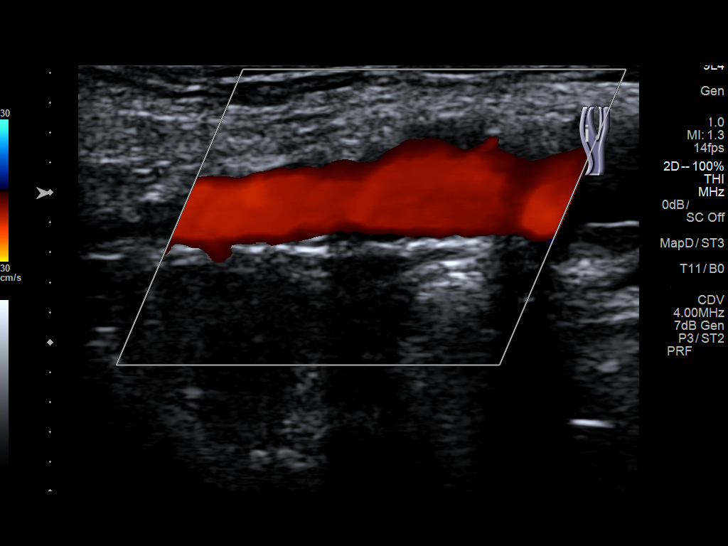
[im 27/62]
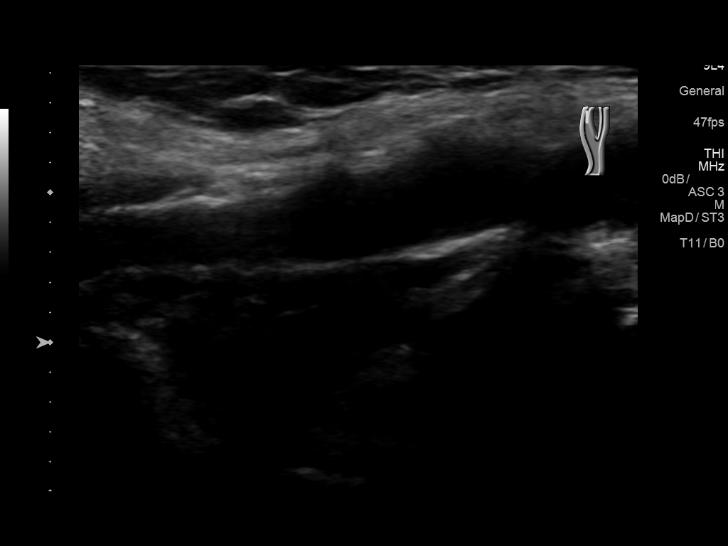
[im 32/62]
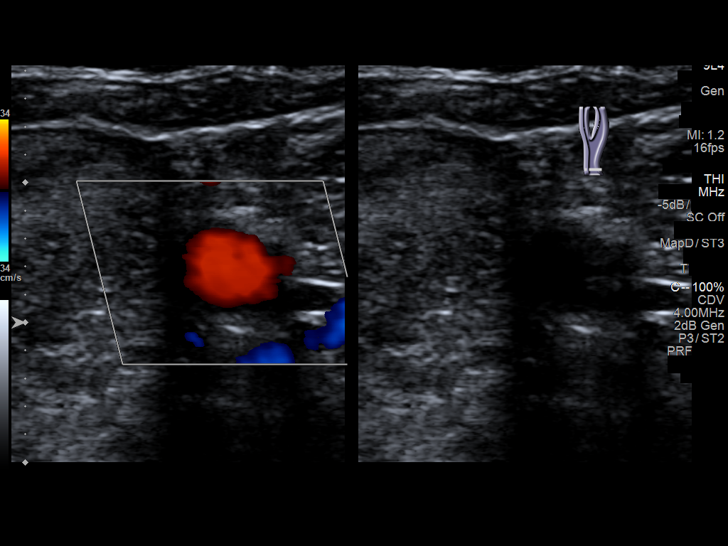
[im 35/62]
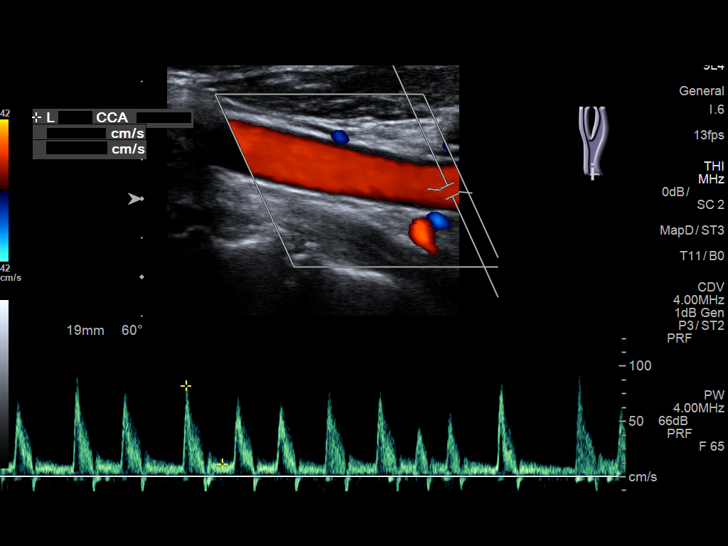
[im 40/62]
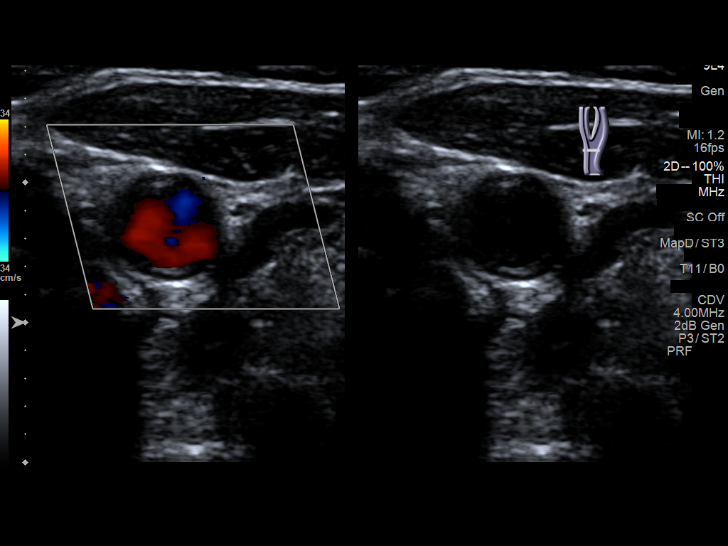
[im 46/62]
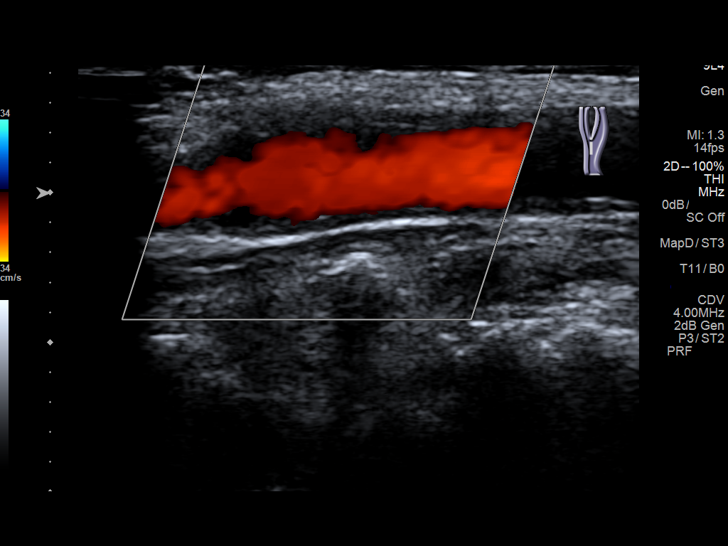
[im 51/62]
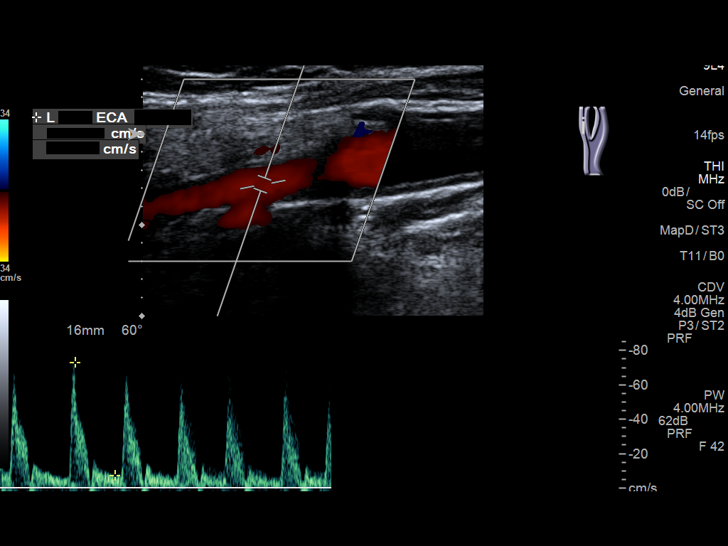
[im 56/62]
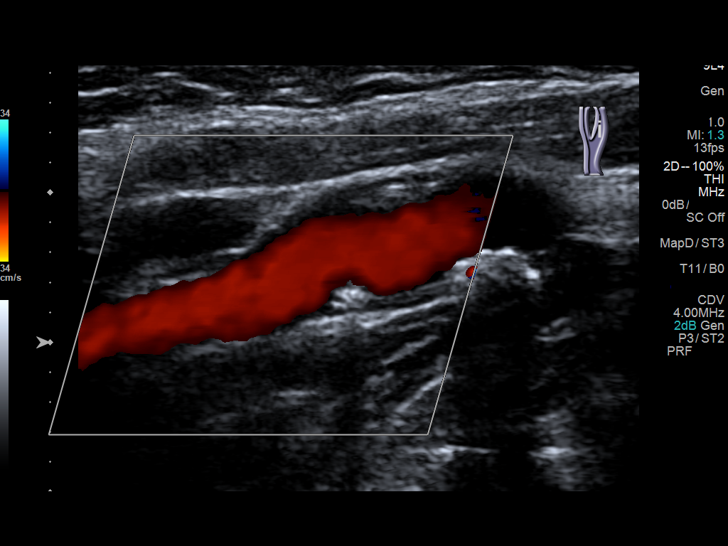
[im 62/62]
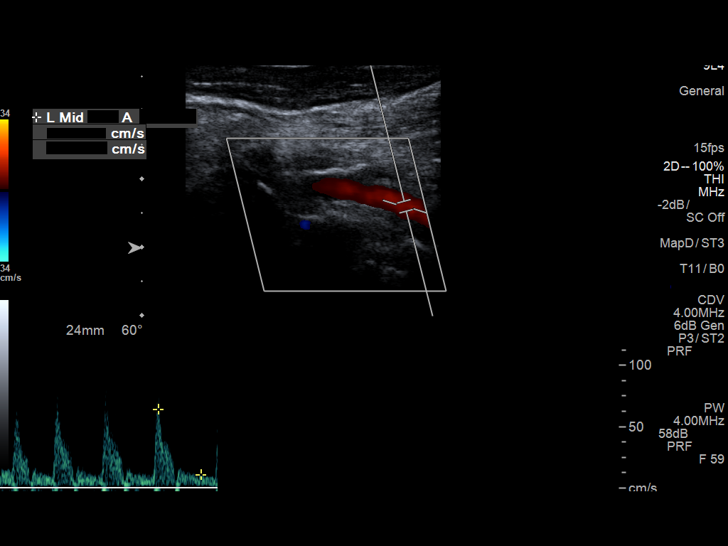

[13 of 24 positions shown; findings below may reference images not displayed]

FINDINGS: Criteria: Quantification of carotid stenosis is based on velocity
parameters that correlate the residual internal carotid diameter
with NASCET-based stenosis levels, using the diameter of the distal
internal carotid lumen as the denominator for stenosis measurement.

The following velocity measurements were obtained:

RIGHT

ICA:  65 cm/sec

CCA:  72 cm/sec

SYSTOLIC ICA/CCA RATIO:

DIASTOLIC ICA/CCA RATIO:

ECA:  85 cm/sec

LEFT

ICA:  47 cm/sec

CCA:  65 cm/sec

SYSTOLIC ICA/CCA RATIO:

DIASTOLIC ICA/CCA RATIO:

ECA:  73 cm/sec

RIGHT CAROTID ARTERY: Right common carotid artery within normal.
Mild-to-moderate soft plaque at the right carotid bifurcation and
proximal internal carotid artery. Doppler waveforms within normal.

RIGHT VERTEBRAL ARTERY:  Normal antegrade flow.

LEFT CAROTID ARTERY: Minimal intimal thickening of the common
carotid artery. Mild soft atherosclerotic plaque at the carotid
bifurcation with mild calcified plaque over the proximal internal
carotid artery. Normal Doppler waveforms.

LEFT VERTEBRAL ARTERY:  Normal antegrade flow.
IMPRESSION: Mild atherosclerotic plaque at the carotid bifurcations and proximal
internal carotid arteries bilaterally. No hemodynamically
significant stenosis.

Normal anterior flow within the vertebral arteries.

## 2017-09-03 IMAGING — CT CT HEAD W/O CM
3 series · 15 of 44 positions shown, 18 images · non-contrast
Comparison: None.

CLINICAL DATA: Patient is with right-sided neglect. History of
dementia.

EXAM:
CT HEAD WITHOUT CONTRAST
TECHNIQUE: Contiguous axial images were obtained from the base of the skull
through the vertex without intravenous contrast.

[Series 2: head wo · axial · 0.47mm/px · z∈[-161,-51]mm · 9 of 27 slices shown, 12 images]
[im 3/27  brain]
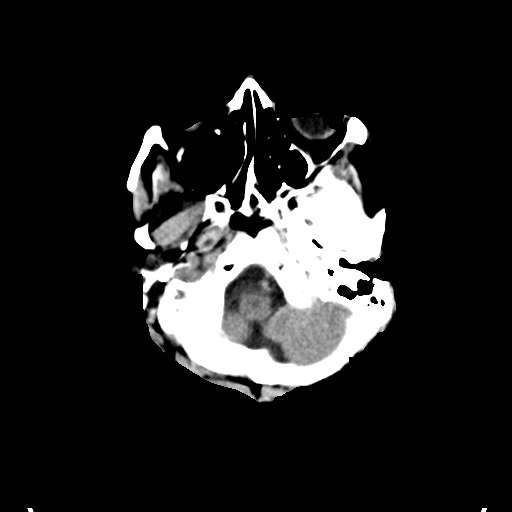
[im 3/27  bone]
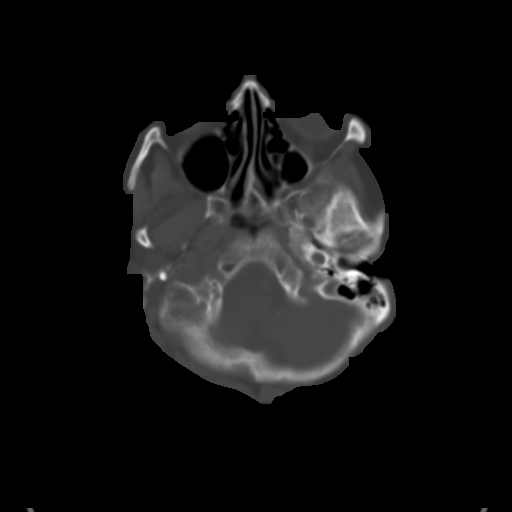
[im 6/27  brain]
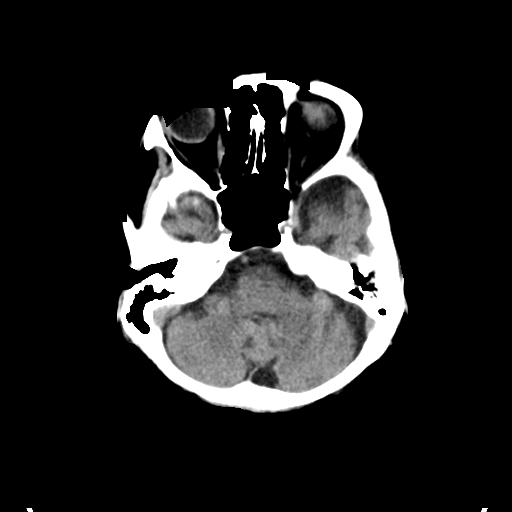
[im 8/27  brain]
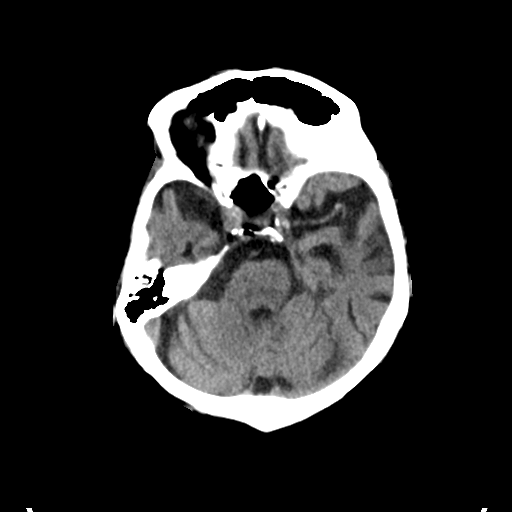
[im 11/27  brain]
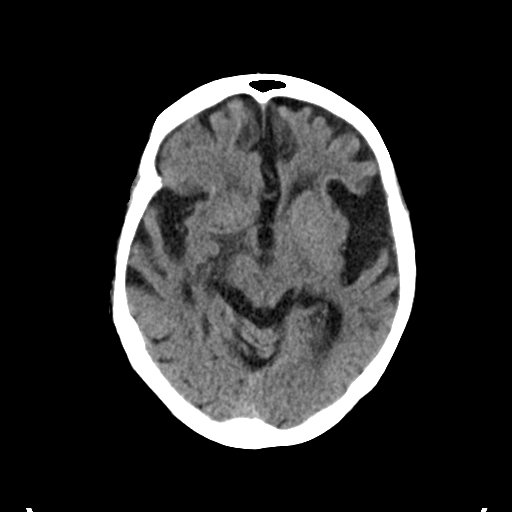
[im 14/27  brain]
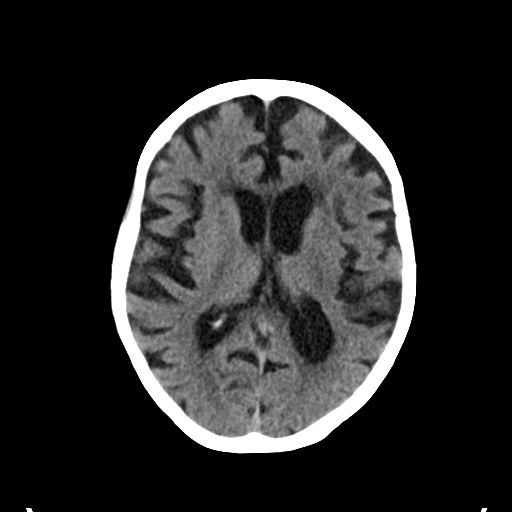
[im 14/27  bone]
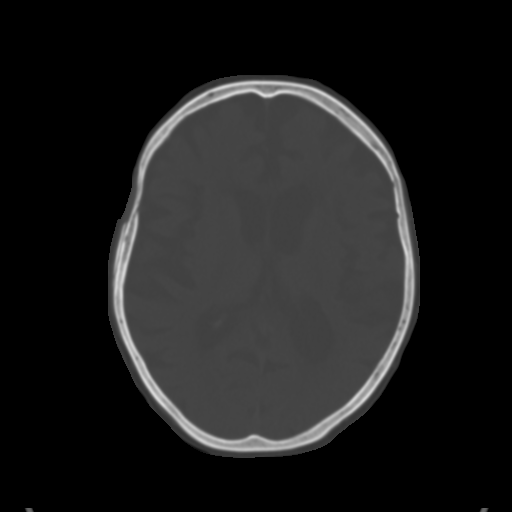
[im 17/27  brain]
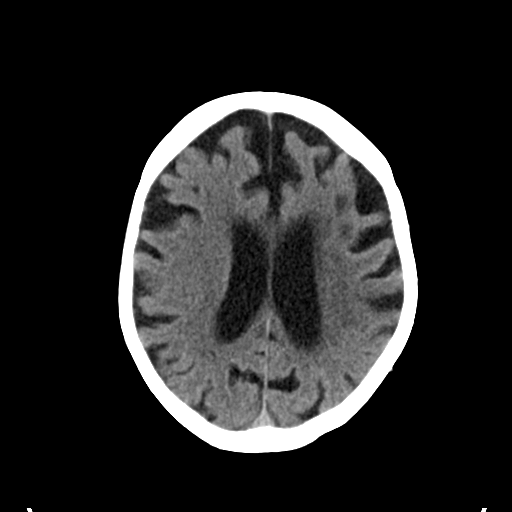
[im 20/27  brain]
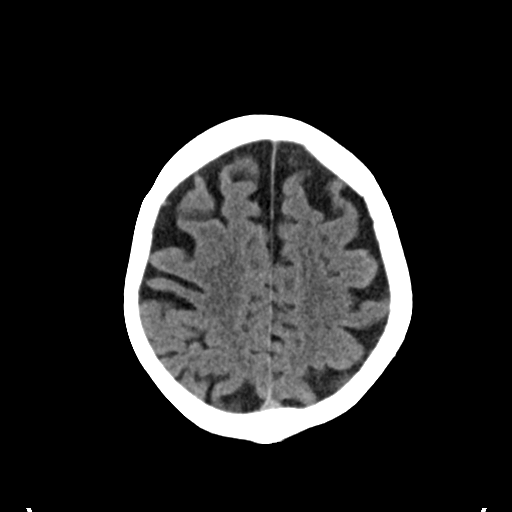
[im 22/27  brain]
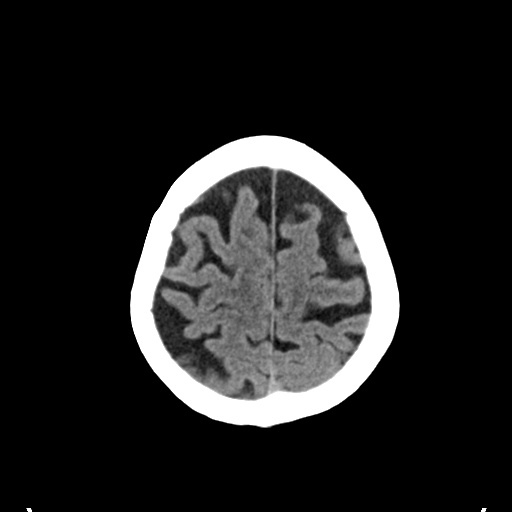
[im 25/27  brain]
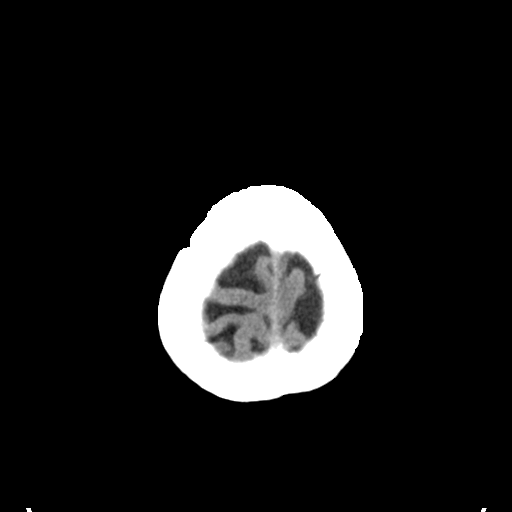
[im 25/27  bone]
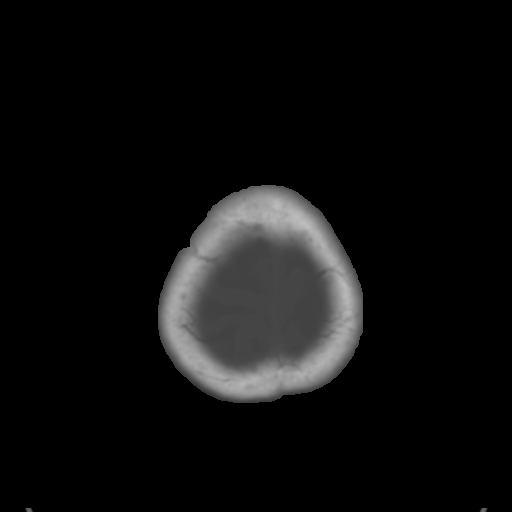

[Series 4: coronal soft tissue · coronal · 0.26mm/px · 3 of 60 slices shown]
[im 20/60  brain]
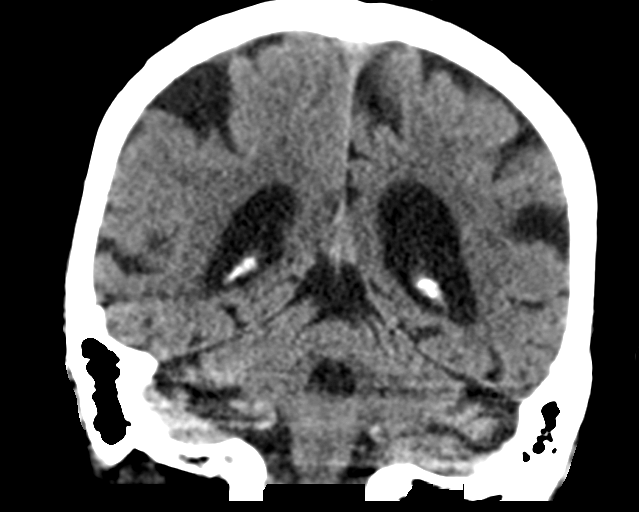
[im 27/60  brain]
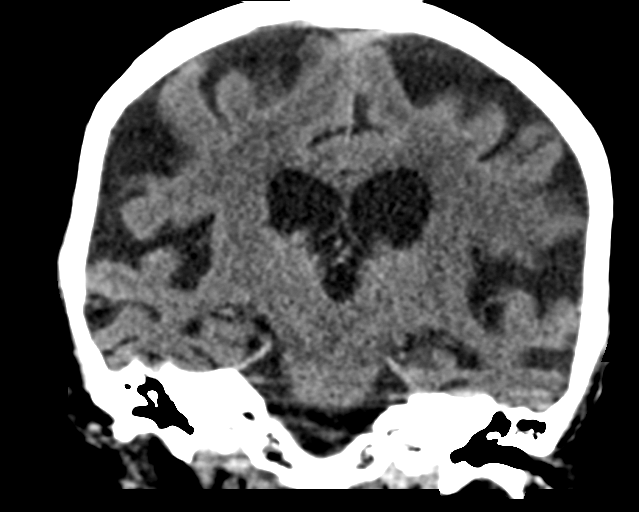
[im 33/60  brain]
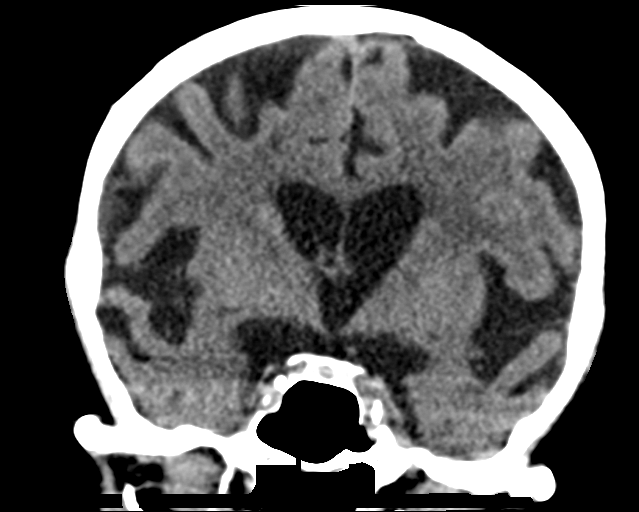

[Series 5: sagittal soft tissue · sagittal · 0.27mm/px · 3 of 51 slices shown]
[im 17/51  brain]
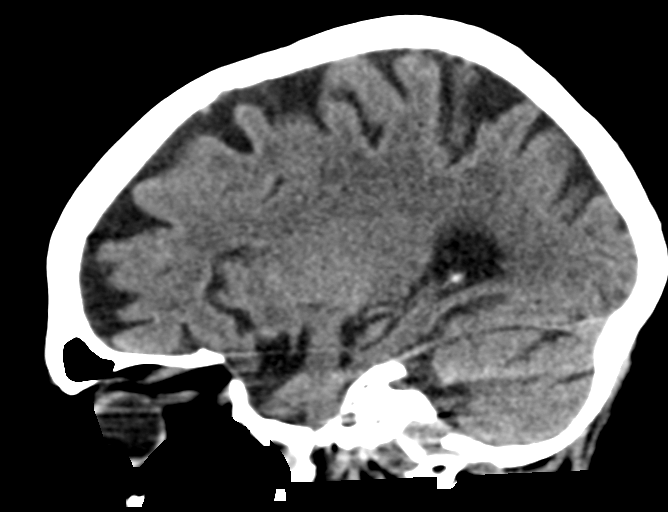
[im 26/51  brain]
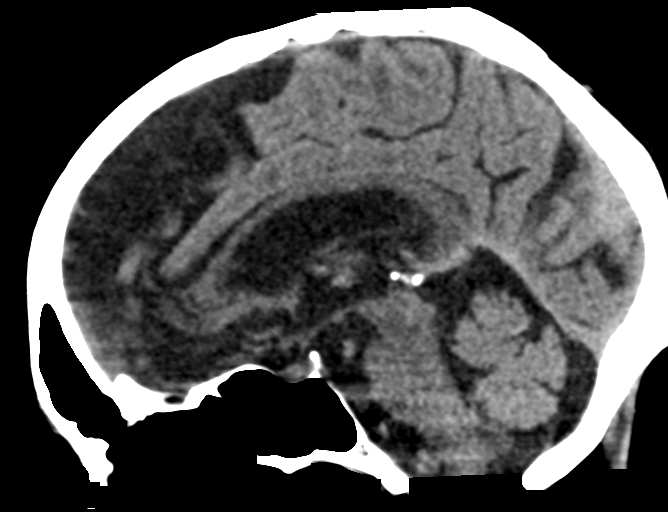
[im 34/51  brain]
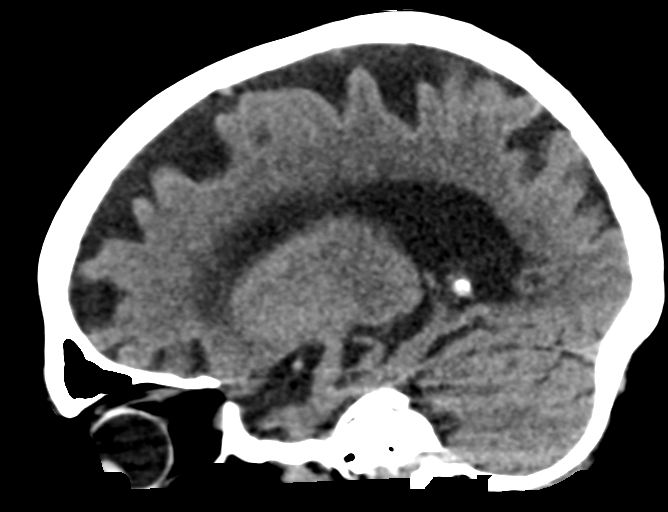

[15 of 44 positions shown; findings below may reference images not displayed]

FINDINGS: Brain: Ventricles and sulci are prominent compatible with atrophy.
Periventricular and subcortical white matter hypodensity compatible
with chronic microvascular ischemic changes. No evidence for acute
cortically based infarct, intracranial hemorrhage, mass lesion or
mass-effect.

Vascular: No hyperdense vessel or unexpected calcification.

Skull: Normal. Negative for fracture or focal lesion.

Sinuses/Orbits: No acute finding.

Other: None.
IMPRESSION: No acute intracranial process.

Chronic microvascular ischemic changes and cortical atrophy.

## 2020-02-17 ENCOUNTER — Other Ambulatory Visit: Payer: Self-pay

## 2020-02-17 ENCOUNTER — Emergency Department: Payer: Medicare HMO

## 2020-02-17 ENCOUNTER — Inpatient Hospital Stay
Admission: EM | Admit: 2020-02-17 | Discharge: 2020-02-19 | DRG: 178 | Disposition: A | Discharge status: Home / Self Care | Payer: Medicare HMO | Attending: Internal Medicine | Admitting: Internal Medicine

## 2020-02-17 DIAGNOSIS — F0391 Unspecified dementia with behavioral disturbance: Secondary | ICD-10-CM | POA: Diagnosis present

## 2020-02-17 DIAGNOSIS — Z20822 Contact with and (suspected) exposure to covid-19: Secondary | ICD-10-CM | POA: Diagnosis present

## 2020-02-17 DIAGNOSIS — R1310 Dysphagia, unspecified: Secondary | ICD-10-CM | POA: Diagnosis present

## 2020-02-17 DIAGNOSIS — J690 Pneumonitis due to inhalation of food and vomit: Principal | ICD-10-CM | POA: Diagnosis present

## 2020-02-17 DIAGNOSIS — M542 Cervicalgia: Secondary | ICD-10-CM | POA: Diagnosis not present

## 2020-02-17 DIAGNOSIS — Z79899 Other long term (current) drug therapy: Secondary | ICD-10-CM

## 2020-02-17 DIAGNOSIS — E042 Nontoxic multinodular goiter: Secondary | ICD-10-CM | POA: Diagnosis present

## 2020-02-17 DIAGNOSIS — L89152 Pressure ulcer of sacral region, stage 2: Secondary | ICD-10-CM | POA: Diagnosis present

## 2020-02-17 DIAGNOSIS — I482 Chronic atrial fibrillation, unspecified: Secondary | ICD-10-CM | POA: Diagnosis present

## 2020-02-17 DIAGNOSIS — Z8673 Personal history of transient ischemic attack (TIA), and cerebral infarction without residual deficits: Secondary | ICD-10-CM

## 2020-02-17 DIAGNOSIS — F0390 Unspecified dementia without behavioral disturbance: Secondary | ICD-10-CM | POA: Diagnosis present

## 2020-02-17 DIAGNOSIS — Z87891 Personal history of nicotine dependence: Secondary | ICD-10-CM

## 2020-02-17 DIAGNOSIS — R54 Age-related physical debility: Secondary | ICD-10-CM | POA: Diagnosis present

## 2020-02-17 DIAGNOSIS — Z66 Do not resuscitate: Secondary | ICD-10-CM | POA: Diagnosis present

## 2020-02-17 DIAGNOSIS — L8990 Pressure ulcer of unspecified site, unspecified stage: Secondary | ICD-10-CM | POA: Insufficient documentation

## 2020-02-17 DIAGNOSIS — R911 Solitary pulmonary nodule: Secondary | ICD-10-CM | POA: Diagnosis present

## 2020-02-17 DIAGNOSIS — Z7901 Long term (current) use of anticoagulants: Secondary | ICD-10-CM

## 2020-02-17 DIAGNOSIS — Z820 Family history of epilepsy and other diseases of the nervous system: Secondary | ICD-10-CM

## 2020-02-17 DIAGNOSIS — T17908A Unspecified foreign body in respiratory tract, part unspecified causing other injury, initial encounter: Secondary | ICD-10-CM | POA: Diagnosis present

## 2020-02-17 DIAGNOSIS — E041 Nontoxic single thyroid nodule: Secondary | ICD-10-CM | POA: Diagnosis present

## 2020-02-17 HISTORY — DX: Chronic atrial fibrillation, unspecified: I48.20

## 2020-02-17 MED ORDER — LIDOCAINE VISCOUS HCL 2 % MT SOLN
15.0000 mL | Freq: Once | OROMUCOSAL | Status: AC
  Administered 2020-02-17: 15 mL via ORAL
  Filled 2020-02-17: qty 15

## 2020-02-17 MED ORDER — SODIUM CHLORIDE 0.9 % IV BOLUS
1000.0000 mL | Freq: Once | INTRAVENOUS | Status: AC
  Administered 2020-02-18: 1000 mL via INTRAVENOUS

## 2020-02-17 MED ORDER — ALUM & MAG HYDROXIDE-SIMETH 200-200-20 MG/5ML PO SUSP
30.0000 mL | Freq: Once | ORAL | Status: AC
  Administered 2020-02-17: 30 mL via ORAL
  Filled 2020-02-17: qty 30

## 2020-02-17 NOTE — ED Triage Notes (Signed)
Per ems pt with history of dementia. Per ems pt became choked on a piece of plastic last night that was removed. Per ems daughter states pt has not been acting appropirately today, has had gurgling in her throat and is not taking po. No resp distress noted.

## 2020-02-17 NOTE — ED Provider Notes (Signed)
[facility] Emergency Department Provider Note  ____________________________________________   First MD Initiated Contact with Patient 02/17/20 2143     (approximate)  I have reviewed the triage vital signs and the nursing notes.   HISTORY  Chief Complaint gurgling in throat    HPI Sara Lowe is a 79 y.o. female  Here with difficulty swallowing, cough, and possible aspiration. History provided primarily by pt's daughter. Per report, pt started coughing and "gurgling" yesterday. She had been alone prior to this, resting near a table. Daughter called EMS to evaluate and pt was noted to have clear breath sounds and normal O2 sats, so she remained home. She continued coughing and acting like she was choking, however, and daughter ultimately reached into her mouth and found a piece of veneer off a table in the back of her mouth. She believes she had ripped this off her bedside table and swallowed it, possibly aspirating it as well. She was well prior to this episode. No known fevers. She has not wanted to eat or drink anything today and has had persistent "gurgling" when breathing with cough.        Past Medical History:  Diagnosis Date  . Dementia   . Dementia     Patient Active Problem List   Diagnosis Date Noted  . Stroke (HCC) 04/04/2016  . CVA (cerebral infarction) 04/04/2016    No past surgical history on file.  Prior to Admission medications   Medication Sig Start Date End Date Taking? Authorizing Provider  apixaban (ELIQUIS) 5 MG TABS tablet Take 1 tablet (5 mg total) by mouth 2 (two) times daily. 04/07/16   Adrian Saran, MD  cholecalciferol (VITAMIN D) 400 units TABS tablet Take 400 Units by mouth.    [provider]  digoxin 62.5 MCG TABS Take 0.0625 mg by mouth daily. 04/07/16   Adrian Saran, MD  Multiple Vitamin (MULTIVITAMIN) capsule Take 1 capsule by mouth daily.    [provider]  rosuvastatin (CRESTOR) 10 MG tablet  Take 1 tablet (10 mg total) by mouth daily. 04/08/16   Adrian Saran, MD    Allergies Patient has no known allergies.  Family History  Problem Relation Age of Onset  . Alzheimer's disease Father     Social History Social History   Tobacco Use  . Smoking status: Former Games developer  . Smokeless tobacco: Never Used  Substance Use Topics  . Alcohol use: No  . Drug use: No    Review of Systems  Review of Systems  Unable to perform ROS: Dementia     ____________________________________________  PHYSICAL EXAM:      VITAL SIGNS: ED Triage Vitals  Enc Vitals Group     BP 02/17/20 2138 103/68     Pulse Rate 02/17/20 2138 104     Resp 02/17/20 2138 18     Temp 02/17/20 2138 (!) 97.3 F (36.3 C)     Temp Source 02/17/20 2138 Axillary     SpO2 02/17/20 2138 99 %     Weight 02/17/20 2139 110 lb (49.9 kg)     Height 02/17/20 2139 5\' 4"  (1.626 m)     Head Circumference --      Peak Flow --      Pain Score --      Pain Loc --      Pain Edu? --      Excl. in GC? --      Physical Exam Vitals and nursing note reviewed.  Constitutional:      General: She is not in acute distress.    Appearance: She is well-developed.  HENT:     Head: Normocephalic and atraumatic.     Mouth/Throat:     Mouth: Mucous membranes are dry.  Eyes:     Conjunctiva/sclera: Conjunctivae normal.  Neck:     Comments: Supple, no appreciable crepitance. Cardiovascular:     Rate and Rhythm: Regular rhythm. Tachycardia present.     Heart sounds: Normal heart sounds.  Pulmonary:     Effort: Pulmonary effort is normal. No respiratory distress.     Breath sounds: No wheezing.     Comments: Transmitted upper airway sounds with course rhonchi in lung bases. Normal WOB however. Abdominal:     General: There is no distension.  Musculoskeletal:     Cervical back: Neck supple.  Skin:    General: Skin is warm.     Capillary Refill: Capillary refill takes less than 2 seconds.     Findings: No rash.    Neurological:     Mental Status: She is alert and oriented to person, place, and time.     Motor: No abnormal muscle tone.       ____________________________________________   LABS (all labs ordered are listed, but only abnormal results are displayed)  Labs Reviewed  CULTURE, BLOOD (ROUTINE X 2)  CULTURE, BLOOD (ROUTINE X 2)  CBC WITH DIFFERENTIAL/PLATELET  COMPREHENSIVE METABOLIC PANEL    ____________________________________________  EKG:  ________________________________________  RADIOLOGY All imaging, including plain films, CT scans, and ultrasounds, independently reviewed by me, and interpretations confirmed via formal radiology reads.  ED MD interpretation:   CXR: Streaky opacities in lung bases,   Official radiology report(s): DG Neck Soft Tissue  Result Date: 02/17/2020 CLINICAL DATA:  Ingestion EXAM: NECK SOFT TISSUES - 1+ VIEW COMPARISON:  None. FINDINGS: There is no evidence of retropharyngeal soft tissue swelling or epiglottic enlargement. The cervical airway is unremarkable and no radio-opaque foreign body identified. Degenerative changes are seen in the cervical spine. IMPRESSION: Negative. Electronically Signed   By: Jonna Clark M.D.   On: 02/17/2020 22:43   DG Chest Port 1 View  Result Date: 02/17/2020 CLINICAL DATA:  Choked on plastic last night now with gurgling and refusing p.o. intake EXAM: PORTABLE CHEST 1 VIEW COMPARISON:  Radiograph 04/17/2011 FINDINGS: Chronically coarsened interstitial changes are present, similar to comparison exam which demonstrated hyperinflation and features of COPD. Some more patchy and streaky opacities are present in the lung bases and retrocardiac space which could be secondary to atelectatic change or sequela of aspiration. No pneumothorax or visible effusion. Stable biapical pleuroparenchymal scarring. The aorta is calcified. The remaining cardiomediastinal contours are unremarkable. No acute osseous abnormality.  Dextrocurvature of the thoracolumbar junction similar to prior. Air-filled loops of colon upper abdominal bowel are nonspecific. Remaining soft tissues are unremarkable. IMPRESSION: 1. Some mild streaky, ill-defined opacities in the lung bases could reflect atelectatic change or sequela of aspiration given recent clinical history. 2. Background of chronically coarsened interstitial changes and hyperinflation compatible with COPD and similar to prior. 3. Numerous air-filled loops of bowel in the upper abdomen, correlate for abdominal symptoms. 4.  Aortic Atherosclerosis (ICD10-I70.0). Electronically Signed   By: Kreg Shropshire M.D.   On: 02/17/2020 22:38    ____________________________________________  PROCEDURES   Procedure(s) performed (including Critical Care):  Procedures  ____________________________________________  INITIAL IMPRESSION / MDM / ASSESSMENT AND PLAN / ED COURSE  As part of my medical decision making, I reviewed  the following data within the electronic MEDICAL RECORD NUMBER Nursing notes reviewed and incorporated, Old chart reviewed, Notes from prior ED visits, and [place] Controlled Substance Database       *JASMINNE MEALY was evaluated in Emergency Department on 02/17/2020 for the symptoms described in the history of present illness. She was evaluated in the context of the global COVID-19 pandemic, which necessitated consideration that the patient might be at risk for infection with the SARS-CoV-2 virus that causes COVID-19. Institutional protocols and algorithms that pertain to the evaluation of patients at risk for COVID-19 are in a state of rapid change based on information released by regulatory bodies including the CDC and federal and state organizations. These policies and algorithms were followed during the patient's care in the ED.  Some ED evaluations and interventions may be delayed as a result of limited staffing during the pandemic.*     Medical Decision Making:  79 yo F  here with suspected FB ingestion and aspiration. Pt non-toxic on exam bot does have b/l rhonchi and transmitted upper airway sounds. Plain films obtained show likely aspiration, as well as numerous air-filled loops of bowel in the upper abdomen. Given that we are unsure what/how much pt swallowed, and she is now refusing PO, will check labs as well as CT C/A/P. Will likely need admission if she continues to refuse PO. Will see if GI cocktail w/ lidocaine helps with this. Daughter updated and in agreement with this plan.  Patient care transferred to Dr. Don Perking at the end of my shift. Patient presentation, ED course, and plan of care discussed with review of all pertinent labs and imaging. Please see his/her note for further details regarding further ED course and disposition.   ____________________________________________  FINAL CLINICAL IMPRESSION(S) / ED DIAGNOSES  Final diagnoses:  Aspiration pneumonia of both lower lobes, unspecified aspiration pneumonia type (HCC)     MEDICATIONS GIVEN DURING THIS VISIT:  Medications  sodium chloride 0.9 % bolus 1,000 mL (has no administration in time range)  alum & mag hydroxide-simeth (MAALOX/MYLANTA) 200-200-20 MG/5ML suspension 30 mL (30 mLs Oral Given 02/17/20 2253)    And  lidocaine (XYLOCAINE) 2 % viscous mouth solution 15 mL (15 mLs Oral Given 02/17/20 2254)     ED Discharge Orders    None       Note:  This document was prepared using Dragon voice recognition software and may include unintentional dictation errors.   Shaune Pollack, MD 02/17/20 [phone]

## 2020-02-18 ENCOUNTER — Other Ambulatory Visit: Payer: Self-pay

## 2020-02-18 ENCOUNTER — Emergency Department: Payer: Medicare HMO

## 2020-02-18 ENCOUNTER — Encounter: Payer: Self-pay | Admitting: Radiology

## 2020-02-18 DIAGNOSIS — Z8673 Personal history of transient ischemic attack (TIA), and cerebral infarction without residual deficits: Secondary | ICD-10-CM | POA: Diagnosis not present

## 2020-02-18 DIAGNOSIS — Z7901 Long term (current) use of anticoagulants: Secondary | ICD-10-CM | POA: Diagnosis not present

## 2020-02-18 DIAGNOSIS — R911 Solitary pulmonary nodule: Secondary | ICD-10-CM | POA: Diagnosis present

## 2020-02-18 DIAGNOSIS — Z79899 Other long term (current) drug therapy: Secondary | ICD-10-CM | POA: Diagnosis not present

## 2020-02-18 DIAGNOSIS — F0391 Unspecified dementia with behavioral disturbance: Secondary | ICD-10-CM | POA: Diagnosis present

## 2020-02-18 DIAGNOSIS — E041 Nontoxic single thyroid nodule: Secondary | ICD-10-CM

## 2020-02-18 DIAGNOSIS — T17908A Unspecified foreign body in respiratory tract, part unspecified causing other injury, initial encounter: Secondary | ICD-10-CM

## 2020-02-18 DIAGNOSIS — F0390 Unspecified dementia without behavioral disturbance: Secondary | ICD-10-CM | POA: Diagnosis present

## 2020-02-18 DIAGNOSIS — Z820 Family history of epilepsy and other diseases of the nervous system: Secondary | ICD-10-CM | POA: Diagnosis not present

## 2020-02-18 DIAGNOSIS — L8990 Pressure ulcer of unspecified site, unspecified stage: Secondary | ICD-10-CM | POA: Insufficient documentation

## 2020-02-18 DIAGNOSIS — R1310 Dysphagia, unspecified: Secondary | ICD-10-CM | POA: Diagnosis present

## 2020-02-18 DIAGNOSIS — J690 Pneumonitis due to inhalation of food and vomit: Secondary | ICD-10-CM | POA: Diagnosis present

## 2020-02-18 DIAGNOSIS — Z20822 Contact with and (suspected) exposure to covid-19: Secondary | ICD-10-CM | POA: Diagnosis present

## 2020-02-18 DIAGNOSIS — L89152 Pressure ulcer of sacral region, stage 2: Secondary | ICD-10-CM | POA: Diagnosis present

## 2020-02-18 DIAGNOSIS — I482 Chronic atrial fibrillation, unspecified: Secondary | ICD-10-CM | POA: Diagnosis present

## 2020-02-18 DIAGNOSIS — Z87891 Personal history of nicotine dependence: Secondary | ICD-10-CM | POA: Diagnosis not present

## 2020-02-18 DIAGNOSIS — R54 Age-related physical debility: Secondary | ICD-10-CM | POA: Diagnosis present

## 2020-02-18 DIAGNOSIS — M542 Cervicalgia: Secondary | ICD-10-CM | POA: Diagnosis present

## 2020-02-18 DIAGNOSIS — Z66 Do not resuscitate: Secondary | ICD-10-CM | POA: Diagnosis present

## 2020-02-18 DIAGNOSIS — E042 Nontoxic multinodular goiter: Secondary | ICD-10-CM | POA: Diagnosis present

## 2020-02-18 LAB — CBC WITH DIFFERENTIAL/PLATELET
Abs Immature Granulocytes: 0.02 10*3/uL (ref 0.00–0.07)
Basophils Absolute: 0 10*3/uL (ref 0.0–0.1)
Basophils Relative: 0 %
Eosinophils Absolute: 0 10*3/uL (ref 0.0–0.5)
Eosinophils Relative: 0 %
HCT: 36.4 % (ref 36.0–46.0)
Hemoglobin: 12 g/dL (ref 12.0–15.0)
Immature Granulocytes: 0 %
Lymphocytes Relative: 14 %
Lymphs Abs: 1.2 10*3/uL (ref 0.7–4.0)
MCH: 30.5 pg (ref 26.0–34.0)
MCHC: 33 g/dL (ref 30.0–36.0)
MCV: 92.4 fL (ref 80.0–100.0)
Monocytes Absolute: 0.5 10*3/uL (ref 0.1–1.0)
Monocytes Relative: 6 %
Neutro Abs: 6.5 10*3/uL (ref 1.7–7.7)
Neutrophils Relative %: 80 %
Platelets: 169 10*3/uL (ref 150–400)
RBC: 3.94 MIL/uL (ref 3.87–5.11)
RDW: 12.3 % (ref 11.5–15.5)
WBC: 8.3 10*3/uL (ref 4.0–10.5)
nRBC: 0 % (ref 0.0–0.2)

## 2020-02-18 LAB — SARS CORONAVIRUS 2 BY RT PCR (HOSPITAL ORDER, PERFORMED IN ~~LOC~~ HOSPITAL LAB): SARS Coronavirus 2: NEGATIVE

## 2020-02-18 LAB — CBC
HCT: 36.6 % (ref 36.0–46.0)
Hemoglobin: 12.3 g/dL (ref 12.0–15.0)
MCH: 31.4 pg (ref 26.0–34.0)
MCHC: 33.6 g/dL (ref 30.0–36.0)
MCV: 93.4 fL (ref 80.0–100.0)
Platelets: 147 10*3/uL — ABNORMAL LOW (ref 150–400)
RBC: 3.92 MIL/uL (ref 3.87–5.11)
RDW: 12.4 % (ref 11.5–15.5)
WBC: 9.2 10*3/uL (ref 4.0–10.5)
nRBC: 0 % (ref 0.0–0.2)

## 2020-02-18 LAB — BASIC METABOLIC PANEL
Anion gap: 12 (ref 5–15)
BUN: 17 mg/dL (ref 8–23)
CO2: 22 mmol/L (ref 22–32)
Calcium: 8.7 mg/dL — ABNORMAL LOW (ref 8.9–10.3)
Chloride: 105 mmol/L (ref 98–111)
Creatinine, Ser: 0.61 mg/dL (ref 0.44–1.00)
GFR calc Af Amer: >60 mL/min (ref >60)
GFR calc non Af Amer: >60 mL/min (ref >60)
Glucose, Bld: 118 mg/dL — ABNORMAL HIGH (ref 70–99)
Potassium: 4 mmol/L (ref 3.5–5.1)
Sodium: 139 mmol/L (ref 135–145)

## 2020-02-18 LAB — COMPREHENSIVE METABOLIC PANEL
ALT: 8 U/L (ref 0–44)
AST: 22 U/L (ref 15–41)
Albumin: 4.1 g/dL (ref 3.5–5.0)
Alkaline Phosphatase: 61 U/L (ref 38–126)
Anion gap: 13 (ref 5–15)
BUN: 22 mg/dL (ref 8–23)
CO2: 23 mmol/L (ref 22–32)
Calcium: 9 mg/dL (ref 8.9–10.3)
Chloride: 101 mmol/L (ref 98–111)
Creatinine, Ser: 0.69 mg/dL (ref 0.44–1.00)
GFR calc Af Amer: >60 mL/min (ref >60)
GFR calc non Af Amer: >60 mL/min (ref >60)
Glucose, Bld: 133 mg/dL — ABNORMAL HIGH (ref 70–99)
Potassium: 3.8 mmol/L (ref 3.5–5.1)
Sodium: 137 mmol/L (ref 135–145)
Total Bilirubin: 1.5 mg/dL — ABNORMAL HIGH (ref 0.3–1.2)
Total Protein: 7.5 g/dL (ref 6.5–8.1)

## 2020-02-18 LAB — PROCALCITONIN: Procalcitonin: <0.1 ng/mL

## 2020-02-18 MED ORDER — ONDANSETRON HCL 4 MG PO TABS: 4.0000 mg | ORAL_TABLET | Freq: Four times a day (QID) | ORAL | Status: DC | PRN

## 2020-02-18 MED ORDER — LORAZEPAM 2 MG/ML IJ SOLN
0.5000 mg | Freq: Three times a day (TID) | INTRAMUSCULAR | Status: DC | PRN
  Administered 2020-02-18: 0.5 mg via INTRAVENOUS
  Filled 2020-02-18: qty 1

## 2020-02-18 MED ORDER — POLYETHYLENE GLYCOL 3350 17 G PO PACK
17.0000 g | PACK | Freq: Every day | ORAL | Status: DC
  Administered 2020-02-19: 17 g via ORAL
  Filled 2020-02-18: qty 1

## 2020-02-18 MED ORDER — IOHEXOL 300 MG/ML  SOLN
75.0000 mL | Freq: Once | INTRAMUSCULAR | Status: AC | PRN
  Administered 2020-02-18: 75 mL via INTRAVENOUS

## 2020-02-18 MED ORDER — ASPIRIN EC 81 MG PO TBEC
81.0000 mg | DELAYED_RELEASE_TABLET | Freq: Every day | ORAL | Status: DC
  Administered 2020-02-19: 10:00:00 81 mg via ORAL
  Filled 2020-02-18: qty 1

## 2020-02-18 MED ORDER — DIGOXIN 125 MCG PO TABS
0.0625 mg | ORAL_TABLET | Freq: Every day | ORAL | Status: DC
  Administered 2020-02-19: 0.0625 mg via ORAL
  Filled 2020-02-18 (×2): qty 0.5

## 2020-02-18 MED ORDER — ENOXAPARIN SODIUM 40 MG/0.4ML ~~LOC~~ SOLN
40.0000 mg | SUBCUTANEOUS | Status: DC
  Administered 2020-02-18 – 2020-02-19 (×2): 40 mg via SUBCUTANEOUS
  Filled 2020-02-18 (×2): qty 0.4

## 2020-02-18 MED ORDER — ONDANSETRON HCL 4 MG/2ML IJ SOLN: 4.0000 mg | Freq: Four times a day (QID) | INTRAMUSCULAR | Status: DC | PRN

## 2020-02-18 MED ORDER — ROSUVASTATIN CALCIUM 10 MG PO TABS
10.0000 mg | ORAL_TABLET | Freq: Every day | ORAL | Status: DC
  Administered 2020-02-19: 10 mg via ORAL
  Filled 2020-02-18: qty 1

## 2020-02-18 MED ORDER — ACETAMINOPHEN 325 MG PO TABS: 650.0000 mg | ORAL_TABLET | Freq: Four times a day (QID) | ORAL | Status: DC | PRN

## 2020-02-18 MED ORDER — SODIUM CHLORIDE 0.9 % IV SOLN
3.0000 g | Freq: Once | INTRAVENOUS | Status: AC
  Administered 2020-02-18: 3 g via INTRAVENOUS
  Filled 2020-02-18: qty 8

## 2020-02-18 MED ORDER — SODIUM CHLORIDE 0.9 % IV SOLN: INTRAVENOUS | Status: AC

## 2020-02-18 MED ORDER — ACETAMINOPHEN 650 MG RE SUPP
650.0000 mg | Freq: Four times a day (QID) | RECTAL | Status: DC | PRN
  Administered 2020-02-18 (×2): 650 mg via RECTAL
  Filled 2020-02-18 (×2): qty 1

## 2020-02-18 MED ORDER — SODIUM CHLORIDE 0.9 % IV SOLN
3.0000 g | Freq: Four times a day (QID) | INTRAVENOUS | Status: DC
  Administered 2020-02-18 – 2020-02-19 (×5): 3 g via INTRAVENOUS
  Filled 2020-02-18: qty 8
  Filled 2020-02-18 (×3): qty 3
  Filled 2020-02-18: qty 8
  Filled 2020-02-18: qty 3
  Filled 2020-02-18: qty 8

## 2020-02-18 NOTE — Progress Notes (Addendum)
   02/18/20 1121  Assess: MEWS Score  Temp 100.2 F (37.9 C)  BP (!) 146/84  Pulse Rate 99  Resp 16  Level of Consciousness Responds to Voice  SpO2 (!) 88 %  O2 Device Nasal Cannula  O2 Flow Rate (L/min) 2 L/min  Assess: MEWS Score  MEWS Temp 0  MEWS Systolic 0  MEWS Pulse 0  MEWS RR 0  MEWS LOC 1  MEWS Score 1  MEWS Score Color Green  Assess: if the MEWS score is Yellow or Red  Were vital signs taken at a resting state? Yes  Focused Assessment Change from prior assessment (see assessment flowsheet)  Early Detection of Sepsis Score *See Row Information* Low  MEWS guidelines implemented *See Row Information* No, previously yellow, continue vital signs every 4 hours  Notify: Charge Nurse/RN  Name of Charge Nurse/RN Notified Y. Cross  Date Charge Nurse/RN Notified 02/18/20  Time Charge Nurse/RN Notified 1121  Document  Patient Outcome Stabilized after interventions  MEWS score decreasing; continue to monitor and assess pt; Dr Enedina Finner making pt rounds on the floor, verbally made MD aware; no new orders received

## 2020-02-18 NOTE — ED Provider Notes (Signed)
_________________________ 2:50 AM on 02/18/2020 -----------------------------------------  CT concerning for multifocal pneumonia. ddx atypical infection versus Covid versus aspiration.  Will treat with Unasyn.  Will admit to the hospitalist service.  Covid swabs pending.   Don Perking, [place], MD 02/18/20 [phone]

## 2020-02-18 NOTE — ED Notes (Signed)
Radiology reports when she went into room to get patient for CT scan, the daughter who is at bedside has wrapped sheets around the patient to prevent her from getting out of bed. Rad tech took sheets off of patient and took her to CT. Returned with her and reported to this RN

## 2020-02-18 NOTE — Progress Notes (Signed)
   02/18/20 1500  Assess: MEWS Score  Temp 100 F (37.8 C)  BP 120/84  Pulse Rate 101  Resp 16  Level of Consciousness Alert  SpO2 99 %  O2 Device Nasal Cannula  O2 Flow Rate (L/min) 2 L/min  Assess: MEWS Score  MEWS Temp 0  MEWS Systolic 0  MEWS Pulse 1  MEWS RR 0  MEWS LOC 0  MEWS Score 1  MEWS Score Color Green  Assess: if the MEWS score is Yellow or Red  Were vital signs taken at a resting state? Yes  Focused Assessment No change from prior assessment  Early Detection of Sepsis Score *See Row Information* Low  MEWS guidelines implemented *See Row Information* No, previously yellow, continue vital signs every 4 hours  Notify: Charge Nurse/RN  Name of Charge Nurse/RN Notified Y. Cross   Date Charge Nurse/RN Notified 02/18/20  Time Charge Nurse/RN Notified 1500  Document  Patient Outcome Other (Comment) (PRN Tylenol supp administered; see MAR)  pt's daughter at bedside; no change from previous assessment except fever; administered PRN Tylenol supp as ordered; will recheck temperature

## 2020-02-18 NOTE — Progress Notes (Signed)
   02/18/20 1340  Assess: MEWS Score  Temp 99.5 F (37.5 C)  Pulse Rate (!) 110 (checked with [employer] portable spO2 machine)  Resp 16  Level of Consciousness Responds to Voice  SpO2 93 %  O2 Device Nasal Cannula  O2 Flow Rate (L/min) 2 L/min  Assess: MEWS Score  MEWS Temp 0  MEWS Systolic 0  MEWS Pulse 1  MEWS RR 0  MEWS LOC 1  MEWS Score 2  MEWS Score Color Yellow  Assess: if the MEWS score is Yellow or Red  Were vital signs taken at a resting state? Yes  Focused Assessment Change from prior assessment (see assessment flowsheet)  Early Detection of Sepsis Score *See Row Information* Low  MEWS guidelines implemented *See Row Information* No, previously yellow, continue vital signs every 4 hours  Notify: Charge Nurse/RN  Name of Charge Nurse/RN Notified Y. Cross  Date Charge Nurse/RN Notified 02/18/20  Time Charge Nurse/RN Notified 1340  Document  Patient Outcome Stabilized after interventions  Rechecked temp and O2 sat based upon previous documentation; HR elevated due to pt waking up and moving in the bed; dtr at bedside due to pt living with her and having advanced dementia

## 2020-02-18 NOTE — Progress Notes (Signed)
   02/18/20 1340  Vitals  Temp 99.5 F (37.5 C)  Temp Source Axillary  Pulse Rate (!) 110  Pulse Rate Source Monitor  MEWS COLOR  MEWS Score Color Yellow  Oxygen Therapy  SpO2 93 %  O2 Device Nasal Cannula  O2 Flow Rate (L/min) 2 L/min  MEWS Score  MEWS Temp 0  MEWS Systolic 0  MEWS Pulse 1  MEWS RR 0  MEWS LOC 1  MEWS Score 2  rechecked previous abnormal VS; no change in pt's status, lying in bed sleeping

## 2020-02-18 NOTE — Progress Notes (Signed)
Pharmacy Antibiotic Note  Sara Lowe is a 79 y.o. female admitted on 02/17/2020 with aspiration pneumonia.  Pharmacy has been consulted for Unasyn dosing.  Plan: Will start Unasyn 3g IV q6h per CrCl > 30 ml/min and will continue to monitor renal function and clinical s/sx and adjust per changes in renal function.  Height: 5\' 4"  (162.6 cm) Weight: 49.9 kg (110 lb) IBW/kg (Calculated) : 54.7  Temp (24hrs), Avg:97.3 F (36.3 C), Min:97.3 F (36.3 C), Max:97.3 F (36.3 C)  Recent Labs  Lab 02/18/20 0007  WBC 8.3  CREATININE 0.69    Estimated Creatinine Clearance: 43.4 mL/min (by C-G formula based on SCr of 0.69 mg/dL).    No Known Allergies   Thank you for allowing pharmacy to be a part of this patient's care.  04/19/20, PharmD, BCPS Clinical Pharmacist 02/18/2020 4:50 AM

## 2020-02-18 NOTE — H&P (Signed)
History and Physical    JAYLEENA STILLE INO:[id number] DOB: September 13, 1940 DOA: 02/17/2020  PCP: Patient, No Pcp Per   Patient coming from: Home   Chief Complaint: Choking, coughing after eating/drinking   HPI: KAYLEANA WAITES is a 79 y.o. female with medical history significant for dementia, history of CVA, atrial fibrillation no longer on anticoagulation, now presenting to the emergency department for evaluation of choking and coughing while eating.  She is accompanied by her daughter who assists with the history.  Patient has reportedly been pocketing food, regurgitating food stuff and secretions, and seemed to be choking and coughing when attempting to eat or drink yesterday.  Patient's daughter reports manually removing partially chewed food and also a piece of plastic from the patient's mouth.  The patient lives with her daughter who is there at night but works during the day, and at her baseline is said to ambulate independently but requires some assistance with dressing and eating, is oriented to person only, and frequently becomes agitated.  ED Course: Upon arrival to the ED, patient is found to be afebrile, saturating well on room air, slightly tachycardic, and with stable blood pressure.  EKG features atrial fibrillation with rate 109.  CT chest demonstrates multifocal patchy airspace opacities and tree-in-bud opacities in the right lateral and left lower lobes suggestive of infection or aspiration, as well as fluid in the left lower lobe bronchioles consistent with aspiration.  Incidentally noted is multinodular goiter and lung nodule at the right apex.  Chemistry panel and CBC are unremarkable.  Covid PCR is pending.  Blood cultures were collected in the ED and the patient was given a liter of saline and Unasyn.  Review of Systems:  All other systems reviewed and apart from HPI, are negative.  Past Medical History:  Diagnosis Date  . Atrial fibrillation, chronic (HCC)   . Dementia  (HCC)   . Dementia (HCC)   . History of CVA (cerebrovascular accident) 04/04/2016    History reviewed. No pertinent surgical history.  Social History:   reports that she has quit smoking. She has never used smokeless tobacco. She reports that she does not drink alcohol and does not use drugs.  No Known Allergies  Family History  Problem Relation Age of Onset  . Alzheimer's disease Father      Prior to Admission medications   Medication Sig Start Date End Date Taking? Authorizing Provider  aspirin EC 81 MG tablet Take 81 mg by mouth daily. Swallow whole.   Yes [provider]  digoxin 62.5 MCG TABS Take 0.0625 mg by mouth daily. 04/07/16  Yes Mody, Sital, MD  polyethylene glycol (MIRALAX / GLYCOLAX) 17 g packet Take 17 g by mouth daily.   Yes [provider]  rosuvastatin (CRESTOR) 10 MG tablet Take 1 tablet (10 mg total) by mouth daily. 04/08/16  Yes Adrian Saran, MD    Physical Exam: Vitals:   02/17/20 2138 02/17/20 2139  BP: 103/68   Pulse: 104   Resp: 18   Temp: (!) 97.3 F (36.3 C)   TempSrc: Axillary   SpO2: 99%   Weight:  49.9 kg  Height:  5\' 4"  (1.626 m)    Constitutional: NAD, calm  Eyes: PERTLA, lids and conjunctivae normal ENMT: Mucous membranes are moist. Posterior pharynx clear of any exudate or lesions.   Neck:  supple, non-tender Respiratory: coarse rales, no wheezing. No accessory muscle use.  Cardiovascular: Rate ~100 and irregularly irregular. No extremity edema.  Abdomen: No distension,  no tenderness, soft. Bowel sounds active.  Musculoskeletal: no clubbing / cyanosis. No joint deformity upper and lower extremities.   Skin: no significant rashes, lesions, ulcers. Warm, dry, well-perfused. Neurologic: No gross facial asymmetry. Sensation intact. Moving all extremities spontaneously.  Psychiatric: Sleeping, wakes to voice. Not speaking. Calm.     Labs and Imaging on Admission: I have personally reviewed following labs and imaging  studies  CBC: Recent Labs  Lab 02/18/20 0007  WBC 8.3  NEUTROABS 6.5  HGB 12.0  HCT 36.4  MCV 92.4  PLT 169   Basic Metabolic Panel: Recent Labs  Lab 02/18/20 0007  NA 137  K 3.8  CL 101  CO2 23  GLUCOSE 133*  BUN 22  CREATININE 0.69  CALCIUM 9.0   GFR: Estimated Creatinine Clearance: 43.4 mL/min (by C-G formula based on SCr of 0.69 mg/dL). Liver Function Tests: Recent Labs  Lab 02/18/20 0007  AST 22  ALT 8  ALKPHOS 61  BILITOT 1.5*  PROT 7.5  ALBUMIN 4.1   No results for input(s): LIPASE, AMYLASE in the last 168 hours. No results for input(s): AMMONIA in the last 168 hours. Coagulation Profile: No results for input(s): INR, PROTIME in the last 168 hours. Cardiac Enzymes: No results for input(s): CKTOTAL, CKMB, CKMBINDEX, TROPONINI in the last 168 hours. BNP (last 3 results) No results for input(s): PROBNP in the last 8760 hours. HbA1C: No results for input(s): HGBA1C in the last 72 hours. CBG: No results for input(s): GLUCAP in the last 168 hours. Lipid Profile: No results for input(s): CHOL, HDL, LDLCALC, TRIG, CHOLHDL, LDLDIRECT in the last 72 hours. Thyroid Function Tests: No results for input(s): TSH, T4TOTAL, FREET4, T3FREE, THYROIDAB in the last 72 hours. Anemia Panel: No results for input(s): VITAMINB12, FOLATE, FERRITIN, TIBC, IRON, RETICCTPCT in the last 72 hours. Urine analysis:    Component Value Date/Time   COLORURINE YELLOW (A) 04/05/2016 0259   APPEARANCEUR CLOUDY (A) 04/05/2016 0259   LABSPEC 1.019 04/05/2016 0259   PHURINE 5.0 04/05/2016 0259   GLUCOSEU NEGATIVE 04/05/2016 0259   HGBUR 1+ (A) 04/05/2016 0259   BILIRUBINUR NEGATIVE 04/05/2016 0259   KETONESUR TRACE (A) 04/05/2016 0259   PROTEINUR NEGATIVE 04/05/2016 0259   NITRITE NEGATIVE 04/05/2016 0259   LEUKOCYTESUR 2+ (A) 04/05/2016 0259   Sepsis Labs: @LABRCNTIP (procalcitonin:4,lacticidven:4) )No results found for this or any previous visit (from the past 240 hour(s)).    Radiological Exams on Admission: DG Neck Soft Tissue  Result Date: 02/17/2020 CLINICAL DATA:  Ingestion EXAM: NECK SOFT TISSUES - 1+ VIEW COMPARISON:  None. FINDINGS: There is no evidence of retropharyngeal soft tissue swelling or epiglottic enlargement. The cervical airway is unremarkable and no radio-opaque foreign body identified. Degenerative changes are seen in the cervical spine. IMPRESSION: Negative. Electronically Signed   By: 02/19/2020 M.D.   On: 02/17/2020 22:43   CT Chest W Contrast  Result Date: 02/18/2020 CLINICAL DATA:  Dementia, choked on a piece of plastic change in mental status EXAM: CT CHEST WITH CONTRAST TECHNIQUE: Multidetector CT imaging of the chest was performed during intravenous contrast administration. CONTRAST:  34mL OMNIPAQUE IOHEXOL 300 MG/ML  SOLN COMPARISON:  None. FINDINGS: Cardiovascular: Scattered aortic atherosclerosis noted. Coronary artery calcifications are seen. There is trace pericardial effusion. The heart size is normal. Mediastinum/Nodes: There are no enlarged mediastinal, hilar or axillary lymph nodes. There is multinodular heterogeneously hypodense goiter seen within the left thyroid lobe measuring 6.3 x 3.0 cm extending into the upper mediastinum and causing rightward tracheal deviation.  There also appears to be fluid obstructing the bronchioles of the posterior left lower lobe. Lungs/Pleura: Multifocal patchy areas of ill-defined peripherally based nodular opacities are noted. There is biapical scarring noted. A more spiculated pulmonary nodule seen in the anterior right upper lobe measuring 9 mm. Areas of tree-in-bud opacities with bronchial wall thickening are seen within the lingula, right middle lobe and posterior left lower lobe. No pleural effusion is seen. For Musculoskeletal/Chest wall: There is no chest wall mass or suspicious osseous finding. No acute osseous abnormality Abdomen/pelvis: Hepatobiliary: The liver is normal in density without focal  abnormality.The main portal vein is patent. No evidence of calcified gallstones, gallbladder wall thickening or biliary dilatation. Pancreas: Unremarkable. No pancreatic ductal dilatation or surrounding inflammatory changes. Spleen: Normal in size without focal abnormality. Adrenals/Urinary Tract: Both adrenal glands appear normal. The kidneys and collecting system appear normal without evidence of urinary tract calculus or hydronephrosis. Bladder is unremarkable. Stomach/Bowel: The stomach and small bowel are normal in appearance. Mildly dilated air and stool-filled colon seen throughout to the level of the rectum. The rectum is stool-filled and moderately dilated measuring 9 cm in transverse dimension. Vascular/Lymphatic: There are no enlarged mesenteric, retroperitoneal, or pelvic lymph nodes. Scattered dense aortic atherosclerosis is noted. Reproductive: The deep pelvis is grossly unremarkable. Other: No evidence of abdominal wall mass or hernia. Musculoskeletal: No acute or significant osseous findings. A dextroconvex scoliotic curvature of the lumbar spine is seen. Degenerative changes are seen throughout the thoracolumbar spine. IMPRESSION: 1. Multifocal patchy airspace opacities as well as tree-in-bud opacities within the right middle lobe and left lung base which could be due to infectious process, aspiration, or inflammatory process. 2. Ill-defined spiculated 9 mm nodule in the right lung apex. Consider one of the following in 3 months for both low-risk and high-risk individuals: (a) repeat chest CT, (b) follow-up PET-CT, or (c) tissue sampling. This recommendation follows the consensus statement: Guidelines for Management of Incidental Pulmonary Nodules Detected on CT Images: From the Fleischner Society 2017; Radiology 2017; 284:228-243. 3. Fluid within the posterior left lower lobe bronchials, consistent with history of aspiration. 4. Trace pericardial effusion 5. Large heterogeneous multinodular goiter  causing rightward tracheal deviation. Recommend thyroid US (ref: J Am Coll Radiol. 2015 Feb;12(2): 143-50). 6.  Aortic Atherosclerosis (ICD10-I70.0). 7. Moderately dilated stool-filled rectum which could be due to constipation. Electronically Signed   By: Jonna Clark M.D.   On: 02/18/2020 02:17   CT ABDOMEN PELVIS W CONTRAST  Result Date: 02/18/2020 CLINICAL DATA:  Dementia, choked on a piece of plastic change in mental status EXAM: CT CHEST WITH CONTRAST TECHNIQUE: Multidetector CT imaging of the chest was performed during intravenous contrast administration. CONTRAST:  23mL OMNIPAQUE IOHEXOL 300 MG/ML  SOLN COMPARISON:  None. FINDINGS: Cardiovascular: Scattered aortic atherosclerosis noted. Coronary artery calcifications are seen. There is trace pericardial effusion. The heart size is normal. Mediastinum/Nodes: There are no enlarged mediastinal, hilar or axillary lymph nodes. There is multinodular heterogeneously hypodense goiter seen within the left thyroid lobe measuring 6.3 x 3.0 cm extending into the upper mediastinum and causing rightward tracheal deviation. There also appears to be fluid obstructing the bronchioles of the posterior left lower lobe. Lungs/Pleura: Multifocal patchy areas of ill-defined peripherally based nodular opacities are noted. There is biapical scarring noted. A more spiculated pulmonary nodule seen in the anterior right upper lobe measuring 9 mm. Areas of tree-in-bud opacities with bronchial wall thickening are seen within the lingula, right middle lobe and posterior left lower lobe. No pleural  effusion is seen. For Musculoskeletal/Chest wall: There is no chest wall mass or suspicious osseous finding. No acute osseous abnormality Abdomen/pelvis: Hepatobiliary: The liver is normal in density without focal abnormality.The main portal vein is patent. No evidence of calcified gallstones, gallbladder wall thickening or biliary dilatation. Pancreas: Unremarkable. No pancreatic ductal  dilatation or surrounding inflammatory changes. Spleen: Normal in size without focal abnormality. Adrenals/Urinary Tract: Both adrenal glands appear normal. The kidneys and collecting system appear normal without evidence of urinary tract calculus or hydronephrosis. Bladder is unremarkable. Stomach/Bowel: The stomach and small bowel are normal in appearance. Mildly dilated air and stool-filled colon seen throughout to the level of the rectum. The rectum is stool-filled and moderately dilated measuring 9 cm in transverse dimension. Vascular/Lymphatic: There are no enlarged mesenteric, retroperitoneal, or pelvic lymph nodes. Scattered dense aortic atherosclerosis is noted. Reproductive: The deep pelvis is grossly unremarkable. Other: No evidence of abdominal wall mass or hernia. Musculoskeletal: No acute or significant osseous findings. A dextroconvex scoliotic curvature of the lumbar spine is seen. Degenerative changes are seen throughout the thoracolumbar spine. IMPRESSION: 1. Multifocal patchy airspace opacities as well as tree-in-bud opacities within the right middle lobe and left lung base which could be due to infectious process, aspiration, or inflammatory process. 2. Ill-defined spiculated 9 mm nodule in the right lung apex. Consider one of the following in 3 months for both low-risk and high-risk individuals: (a) repeat chest CT, (b) follow-up PET-CT, or (c) tissue sampling. This recommendation follows the consensus statement: Guidelines for Management of Incidental Pulmonary Nodules Detected on CT Images: From the Fleischner Society 2017; Radiology 2017; 284:228-243. 3. Fluid within the posterior left lower lobe bronchials, consistent with history of aspiration. 4. Trace pericardial effusion 5. Large heterogeneous multinodular goiter causing rightward tracheal deviation. Recommend thyroid US (ref: J Am Coll Radiol. 2015 Feb;12(2): 143-50). 6.  Aortic Atherosclerosis (ICD10-I70.0). 7. Moderately dilated  stool-filled rectum which could be due to constipation. Electronically Signed   By: Jonna Clark M.D.   On: 02/18/2020 02:17   DG Chest Port 1 View  Result Date: 02/17/2020 CLINICAL DATA:  Choked on plastic last night now with gurgling and refusing p.o. intake EXAM: PORTABLE CHEST 1 VIEW COMPARISON:  Radiograph 04/17/2011 FINDINGS: Chronically coarsened interstitial changes are present, similar to comparison exam which demonstrated hyperinflation and features of COPD. Some more patchy and streaky opacities are present in the lung bases and retrocardiac space which could be secondary to atelectatic change or sequela of aspiration. No pneumothorax or visible effusion. Stable biapical pleuroparenchymal scarring. The aorta is calcified. The remaining cardiomediastinal contours are unremarkable. No acute osseous abnormality. Dextrocurvature of the thoracolumbar junction similar to prior. Air-filled loops of colon upper abdominal bowel are nonspecific. Remaining soft tissues are unremarkable. IMPRESSION: 1. Some mild streaky, ill-defined opacities in the lung bases could reflect atelectatic change or sequela of aspiration given recent clinical history. 2. Background of chronically coarsened interstitial changes and hyperinflation compatible with COPD and similar to prior. 3. Numerous air-filled loops of bowel in the upper abdomen, correlate for abdominal symptoms. 4.  Aortic Atherosclerosis (ICD10-I70.0). Electronically Signed   By: Kreg Shropshire M.D.   On: 02/17/2020 22:38    EKG: Independently reviewed. Atrial fibrillation, rate 109.   Assessment/Plan   1. Aspiration   - Presents with difficulty swallowing, coughing after eating or drinking, and concern for aspiration  - CT findings are consistent with aspiration  - She is not tachypneic, saturating well on rm air, and not febrile, but is frail  and was started on antibiotics empirically  - Blood cultures were collected in ED and Unasyn started  - Keep  NPO, consult SLP, culture sputum if patient can provide sample, check procalcitonin, and continue Unasyn for now    2. Dementia  - Ambulates independently, eats with assistance, becomes agitated frequently, and is oriented to person only at baseline - Minimize lines and tubes, early mobilization, keep room dark at night and light during day, avoid restraints   3. History of CVA  - Statin and aspirin held pending SLP evaluation    4. Atrial fibrillation   - Patient has chronic atrial fibrillation, not on anticoagulation  - Rate is 90s to low 100s in ED, resume digoxin if able following SLP eval, use IV rate-control agents if needed    5. Incidental imaging findings  - Patient was noted to have a lung nodule in the right apex and multinodular goiter   - Outpatient follow-up and imaging is recommended    DVT prophylaxis: Lovenox  Code Status: DNR, confirmed with daughter  Family Communication: Daughter updated at bedside Disposition Plan:  Patient is from: Home  Anticipated d/c is to: TBD Anticipated d/c is: Possibly just 1 day if she remains stable on room air   Patient currently: Pending further workup including procalcitonin and SLP eval   Consults called: None  Admission status: Observation    Briscoe Deutscherimothy S Jeris Easterly, MD [facility]  02/18/2020, 4:46 AM

## 2020-02-18 NOTE — Progress Notes (Signed)
[facility]  - [place] at [facility]   PATIENT NAME: Sara BradfordJerri Finkler    MR#:  [id number]  DATE OF BIRTH:  1940/12/28  SUBJECTIVE:  patient is resting quietly. Had fever earlier. Received Tylenol sleeping. Daughter at bedside gives mail history. Patient has advanced dementia unable to give any history review of systems REVIEW OF SYSTEMS:   ROS not done secondary to patient having dementia Tolerating Diet: NPO Tolerating PT: pending. At baseline walks at home without walker  DRUG ALLERGIES:  No Known Allergies  VITALS:  Blood pressure (!) 146/84, pulse (!) 110, temperature 99.5 F (37.5 C), temperature source Axillary, resp. rate 16, height 5\' 4"  (1.626 m), weight 49.9 kg, SpO2 93 %.  PHYSICAL EXAMINATION:   Physical Exam limited  GENERAL:  79 y.o.-year-old patient lying in the bed with no acute distress. Thin frail  HEENT: Head atraumatic, normocephalic. Oropharynx and nasopharynx clear.   LUNGS: mild coarse breath sounds bilaterally, no wheezing, rales, rhonchi. No use of accessory muscles of respiration.  CARDIOVASCULAR: S1, S2 normal. No murmurs, rubs, or gallops.  ABDOMEN: Soft, nontender, nondistended. Bowel sounds present. No organomegaly or mass.  EXTREMITIES: No cyanosis, clubbing or edema b/l.    NEUROLOGIC: moves all extremities well  PSYCHIATRIC:  patient is resting. SKIN:  Pressure Injury 02/18/20 Sacrum Stage 2 -  Partial thickness loss of dermis presenting as a shallow open injury with a red, pink wound bed without slough. (Active)  02/18/20 [id number]  Location: Sacrum  Location Orientation:   Staging: Stage 2 -  Partial thickness loss of dermis presenting as a shallow open injury with a red, pink wound bed without slough.  Wound Description (Comments):   Present on Admission: Yes       LABORATORY PANEL:  CBC Recent Labs  Lab 02/18/20 0707  WBC 9.2  HGB 12.3  HCT 36.6  PLT 147*    Chemistries  Recent Labs  Lab 02/18/20 0007  02/18/20 0007 02/18/20 0707  NA 137   < > 139  K 3.8   < > 4.0  CL 101   < > 105  CO2 23   < > 22  GLUCOSE 133*   < > 118*  BUN 22   < > 17  CREATININE 0.69   < > 0.61  CALCIUM 9.0   < > 8.7*  AST 22  --   --   ALT 8  --   --   ALKPHOS 61  --   --   BILITOT 1.5*  --   --    < > = values in this interval not displayed.   Cardiac Enzymes No results for input(s): TROPONINI in the last 168 hours. RADIOLOGY:  DG Neck Soft Tissue  Result Date: 02/17/2020 CLINICAL DATA:  Ingestion EXAM: NECK SOFT TISSUES - 1+ VIEW COMPARISON:  None. FINDINGS: There is no evidence of retropharyngeal soft tissue swelling or epiglottic enlargement. The cervical airway is unremarkable and no radio-opaque foreign body identified. Degenerative changes are seen in the cervical spine. IMPRESSION: Negative. Electronically Signed   By: Jonna ClarkBindu  Avutu M.D.   On: 02/17/2020 22:43   CT Chest W Contrast  Result Date: 02/18/2020 CLINICAL DATA:  Dementia, choked on a piece of plastic change in mental status EXAM: CT CHEST WITH CONTRAST TECHNIQUE: Multidetector CT imaging of the chest was performed during intravenous contrast administration. CONTRAST:  75mL OMNIPAQUE IOHEXOL 300 MG/ML  SOLN COMPARISON:  None. FINDINGS: Cardiovascular: Scattered aortic atherosclerosis noted. Coronary artery calcifications are seen.  There is trace pericardial effusion. The heart size is normal. Mediastinum/Nodes: There are no enlarged mediastinal, hilar or axillary lymph nodes. There is multinodular heterogeneously hypodense goiter seen within the left thyroid lobe measuring 6.3 x 3.0 cm extending into the upper mediastinum and causing rightward tracheal deviation. There also appears to be fluid obstructing the bronchioles of the posterior left lower lobe. Lungs/Pleura: Multifocal patchy areas of ill-defined peripherally based nodular opacities are noted. There is biapical scarring noted. A more spiculated pulmonary nodule seen in the anterior right  upper lobe measuring 9 mm. Areas of tree-in-bud opacities with bronchial wall thickening are seen within the lingula, right middle lobe and posterior left lower lobe. No pleural effusion is seen. For Musculoskeletal/Chest wall: There is no chest wall mass or suspicious osseous finding. No acute osseous abnormality Abdomen/pelvis: Hepatobiliary: The liver is normal in density without focal abnormality.The main portal vein is patent. No evidence of calcified gallstones, gallbladder wall thickening or biliary dilatation. Pancreas: Unremarkable. No pancreatic ductal dilatation or surrounding inflammatory changes. Spleen: Normal in size without focal abnormality. Adrenals/Urinary Tract: Both adrenal glands appear normal. The kidneys and collecting system appear normal without evidence of urinary tract calculus or hydronephrosis. Bladder is unremarkable. Stomach/Bowel: The stomach and small bowel are normal in appearance. Mildly dilated air and stool-filled colon seen throughout to the level of the rectum. The rectum is stool-filled and moderately dilated measuring 9 cm in transverse dimension. Vascular/Lymphatic: There are no enlarged mesenteric, retroperitoneal, or pelvic lymph nodes. Scattered dense aortic atherosclerosis is noted. Reproductive: The deep pelvis is grossly unremarkable. Other: No evidence of abdominal wall mass or hernia. Musculoskeletal: No acute or significant osseous findings. A dextroconvex scoliotic curvature of the lumbar spine is seen. Degenerative changes are seen throughout the thoracolumbar spine. IMPRESSION: 1. Multifocal patchy airspace opacities as well as tree-in-bud opacities within the right middle lobe and left lung base which could be due to infectious process, aspiration, or inflammatory process. 2. Ill-defined spiculated 9 mm nodule in the right lung apex. Consider one of the following in 3 months for both low-risk and high-risk individuals: (a) repeat chest CT, (b) follow-up PET-CT,  or (c) tissue sampling. This recommendation follows the consensus statement: Guidelines for Management of Incidental Pulmonary Nodules Detected on CT Images: From the Fleischner Society 2017; Radiology 2017; 284:228-243. 3. Fluid within the posterior left lower lobe bronchials, consistent with history of aspiration. 4. Trace pericardial effusion 5. Large heterogeneous multinodular goiter causing rightward tracheal deviation. Recommend thyroid US (ref: J Am Coll Radiol. 2015 Feb;12(2): 143-50). 6.  Aortic Atherosclerosis (ICD10-I70.0). 7. Moderately dilated stool-filled rectum which could be due to constipation. Electronically Signed   By: Jonna Clark M.D.   On: 02/18/2020 02:17   CT ABDOMEN PELVIS W CONTRAST  Result Date: 02/18/2020 CLINICAL DATA:  Dementia, choked on a piece of plastic change in mental status EXAM: CT CHEST WITH CONTRAST TECHNIQUE: Multidetector CT imaging of the chest was performed during intravenous contrast administration. CONTRAST:  11mL OMNIPAQUE IOHEXOL 300 MG/ML  SOLN COMPARISON:  None. FINDINGS: Cardiovascular: Scattered aortic atherosclerosis noted. Coronary artery calcifications are seen. There is trace pericardial effusion. The heart size is normal. Mediastinum/Nodes: There are no enlarged mediastinal, hilar or axillary lymph nodes. There is multinodular heterogeneously hypodense goiter seen within the left thyroid lobe measuring 6.3 x 3.0 cm extending into the upper mediastinum and causing rightward tracheal deviation. There also appears to be fluid obstructing the bronchioles of the posterior left lower lobe. Lungs/Pleura: Multifocal patchy areas of ill-defined peripherally  based nodular opacities are noted. There is biapical scarring noted. A more spiculated pulmonary nodule seen in the anterior right upper lobe measuring 9 mm. Areas of tree-in-bud opacities with bronchial wall thickening are seen within the lingula, right middle lobe and posterior left lower lobe. No pleural  effusion is seen. For Musculoskeletal/Chest wall: There is no chest wall mass or suspicious osseous finding. No acute osseous abnormality Abdomen/pelvis: Hepatobiliary: The liver is normal in density without focal abnormality.The main portal vein is patent. No evidence of calcified gallstones, gallbladder wall thickening or biliary dilatation. Pancreas: Unremarkable. No pancreatic ductal dilatation or surrounding inflammatory changes. Spleen: Normal in size without focal abnormality. Adrenals/Urinary Tract: Both adrenal glands appear normal. The kidneys and collecting system appear normal without evidence of urinary tract calculus or hydronephrosis. Bladder is unremarkable. Stomach/Bowel: The stomach and small bowel are normal in appearance. Mildly dilated air and stool-filled colon seen throughout to the level of the rectum. The rectum is stool-filled and moderately dilated measuring 9 cm in transverse dimension. Vascular/Lymphatic: There are no enlarged mesenteric, retroperitoneal, or pelvic lymph nodes. Scattered dense aortic atherosclerosis is noted. Reproductive: The deep pelvis is grossly unremarkable. Other: No evidence of abdominal wall mass or hernia. Musculoskeletal: No acute or significant osseous findings. A dextroconvex scoliotic curvature of the lumbar spine is seen. Degenerative changes are seen throughout the thoracolumbar spine. IMPRESSION: 1. Multifocal patchy airspace opacities as well as tree-in-bud opacities within the right middle lobe and left lung base which could be due to infectious process, aspiration, or inflammatory process. 2. Ill-defined spiculated 9 mm nodule in the right lung apex. Consider one of the following in 3 months for both low-risk and high-risk individuals: (a) repeat chest CT, (b) follow-up PET-CT, or (c) tissue sampling. This recommendation follows the consensus statement: Guidelines for Management of Incidental Pulmonary Nodules Detected on CT Images: From the Fleischner  Society 2017; Radiology 2017; 284:228-243. 3. Fluid within the posterior left lower lobe bronchials, consistent with history of aspiration. 4. Trace pericardial effusion 5. Large heterogeneous multinodular goiter causing rightward tracheal deviation. Recommend thyroid US (ref: J Am Coll Radiol. 2015 Feb;12(2): 143-50). 6.  Aortic Atherosclerosis (ICD10-I70.0). 7. Moderately dilated stool-filled rectum which could be due to constipation. Electronically Signed   By: Jonna Clark M.D.   On: 02/18/2020 02:17   DG Chest Port 1 View  Result Date: 02/17/2020 CLINICAL DATA:  Choked on plastic last night now with gurgling and refusing p.o. intake EXAM: PORTABLE CHEST 1 VIEW COMPARISON:  Radiograph 04/17/2011 FINDINGS: Chronically coarsened interstitial changes are present, similar to comparison exam which demonstrated hyperinflation and features of COPD. Some more patchy and streaky opacities are present in the lung bases and retrocardiac space which could be secondary to atelectatic change or sequela of aspiration. No pneumothorax or visible effusion. Stable biapical pleuroparenchymal scarring. The aorta is calcified. The remaining cardiomediastinal contours are unremarkable. No acute osseous abnormality. Dextrocurvature of the thoracolumbar junction similar to prior. Air-filled loops of colon upper abdominal bowel are nonspecific. Remaining soft tissues are unremarkable. IMPRESSION: 1. Some mild streaky, ill-defined opacities in the lung bases could reflect atelectatic change or sequela of aspiration given recent clinical history. 2. Background of chronically coarsened interstitial changes and hyperinflation compatible with COPD and similar to prior. 3. Numerous air-filled loops of bowel in the upper abdomen, correlate for abdominal symptoms. 4.  Aortic Atherosclerosis (ICD10-I70.0). Electronically Signed   By: Kreg Shropshire M.D.   On: 02/17/2020 22:38   ASSESSMENT AND PLAN:  KATIRA DUMAIS is  a 79 y.o. female with  medical history significant for dementia, history of CVA, atrial fibrillation no longer on anticoagulation, now presenting to the emergency department for evaluation of choking and coughing while eating.  She is accompanied by her daughter who assists with the history.  Patient has reportedly been pocketing food, regurgitating food stuff and secretions, and seemed to be choking and coughing when attempting to eat or drink yesterday.  Patient's daughter reports manually removing partially chewed food and also a piece of plastic from the patient's mouth   1. Aspiration Pneumonia - Presents with difficulty swallowing, coughing after eating or drinking, and concern for aspiration  - CT findings are consistent with aspiration  - She is not tachypneic, saturating well on rm air, and febrile - Blood cultures negative -IV Unasyn started  - Keep NPO, consult SLP, -procalcitonin <0.10--however pt has fever and will cont and continue Unasyn for now    2. Dementia  - Ambulates independently, eats with assistance, becomes agitated frequently, and is oriented to person only at baseline - pt's dter stays with her to help put  3. History of CVA  - Statin and aspirin held pending SLP evaluation    4. Atrial fibrillation chronic - Patient has chronic atrial fibrillation, not on anticoagulation  - Rate is 90s to low 100s in ED, resume digoxin if able following SLP eval, use IV rate-control agents if needed    5. Incidental imaging findings  - Patient was noted to have a lung nodule in the right apex and multinodular goiter   - Outpatient follow-up and imaging is recommended    DVT prophylaxis: Lovenox  Code Status: DNR, confirmed with daughter  Family Communication: Daughter updated at bedside Disposition Plan:  Patient is from: Home  Anticipated d/c is to: TBD Anticipated d/c is: 1-2  Patient currently:  patient continues to have fever. Will continue IV antibiotic. PT OT pending.  Consults  called: None  Admission status:  inpatient    TOTAL TIME TAKING CARE OF THIS PATIENT: *25* minutes.  >50% time spent on counselling and coordination of care  Note: This dictation was prepared with Dragon dictation along with smaller phrase technology. Any transcriptional errors that result from this process are unintentional.  Enedina Finner M.D    [facility]   CC: Primary care physician; Patient, No Pcp PerPatient ID: Jodean Lima, female   DOB: 05/06/1941, 79 y.o.   MRN: [id number]

## 2020-02-18 NOTE — Progress Notes (Signed)
   02/18/20 0854  Assess: MEWS Score  Temp (!) 100.7 F (38.2 C)  Pt received Tylenol supp rectally at 0655; removed one of the blankets from the pt and will continue to assess pt

## 2020-02-19 DIAGNOSIS — I482 Chronic atrial fibrillation, unspecified: Secondary | ICD-10-CM

## 2020-02-19 DIAGNOSIS — R911 Solitary pulmonary nodule: Secondary | ICD-10-CM

## 2020-02-19 DIAGNOSIS — F0391 Unspecified dementia with behavioral disturbance: Secondary | ICD-10-CM

## 2020-02-19 DIAGNOSIS — L8995 Pressure ulcer of unspecified site, unstageable: Secondary | ICD-10-CM

## 2020-02-19 MED ORDER — AMOXICILLIN-POT CLAVULANATE 875-125 MG PO TABS: 1.0000 | ORAL_TABLET | Freq: Two times a day (BID) | ORAL | Status: DC

## 2020-02-19 MED ORDER — AMOXICILLIN-POT CLAVULANATE 875-125 MG PO TABS: 1.0000 | ORAL_TABLET | Freq: Two times a day (BID) | ORAL | 0 refills | Status: AC

## 2020-02-19 NOTE — TOC Initial Note (Signed)
Transition of Care [facility]) - Initial/Assessment Note    Patient Details  Name: Sara Lowe MRN: [id number] Date of Birth: 03/01/1941  [facility] CM/SW Contact:    Allayne Butcher, RN Phone Number: 02/19/2020, 9:47 AM  Clinical Narrative:                 Patient admitted to the hospital for aspiration pneumonia.  Patient's daughter, Darl Pikes, is at the bedside and it the patient's caregiver.  Darl Pikes reports that she does everything for her mother, bathing, dressing, feeding.  Patient is ambulatory independently and does not require a cane or walker.  Patient is incontinent ever since her stroke and has limited mobility of the right side.  Patient has advanced dementia and will attempt to eat none food items like soap or deodorant.  Daughter pulled a piece of the bedside table top, that the patient had picked from, out of her throat before bringing her to the hospital.  Patient is holding food and daughter reports frequently having swipe food from the patient's mouth when she wont swallow.  Darl Pikes works part time, she has a roommate that keeps a check on the patient while she is at work.  Patient is current with her PCP and gets prescriptions from [facility]. Daughter will take patient back home at discharge.  Daughter reports that the patient has behavioral issues and will punch and scratch at times.    Expected Discharge Plan: Home/Self Care Barriers to Discharge: Continued Medical Work up   Patient Goals and CMS Choice Patient states their goals for this hospitalization and ongoing recovery are:: Daughter cares for patient at home and would like to take her home at discharge      Expected Discharge Plan and Services Expected Discharge Plan: Home/Self Care   Discharge Planning Services: CM Consult   Living arrangements for the past 2 months: Single Family Home                           HH Arranged: NA          Prior Living Arrangements/Services Living arrangements  for the past 2 months: Single Family Home Lives with:: Adult Children Patient language and need for interpreter reviewed:: Yes Do you feel safe going back to the place where you live?: Yes      Need for Family Participation in Patient Care: Yes (Comment) (advanced dementia) Care giver support system in place?: Yes (comment) (daughter, granddaughter)   Criminal Activity/Legal Involvement Pertinent to Current Situation/Hospitalization: No - Comment as needed  Activities of Daily Living Home Assistive Devices/Equipment: Shower chair without back ADL Screening (condition at time of admission) Patient's cognitive ability adequate to safely complete daily activities?: No Is the patient deaf or have difficulty hearing?: No Does the patient have difficulty seeing, even when wearing glasses/contacts?: No Does the patient have difficulty concentrating, remembering, or making decisions?: Yes Patient able to express need for assistance with ADLs?: No Does the patient have difficulty dressing or bathing?: Yes Independently performs ADLs?: No Communication: Dependent Is this a change from baseline?: Pre-admission baseline Dressing (OT): Dependent Is this a change from baseline?: Pre-admission baseline Grooming: Dependent Is this a change from baseline?: Pre-admission baseline Feeding: Needs assistance Is this a change from baseline?: Pre-admission baseline Bathing: Dependent Is this a change from baseline?: Pre-admission baseline Toileting: Dependent Is this a change from baseline?: Pre-admission baseline In/Out Bed: Needs assistance Is this a change from baseline?: Pre-admission baseline  Walks in Home: Needs assistance Is this a change from baseline?: Pre-admission baseline Does the patient have difficulty walking or climbing stairs?: Yes Weakness of Legs: Both Weakness of Arms/Hands: None  Permission Sought/Granted Permission sought to share information with : Case Manager, Family  Supports Permission granted to share information with : Yes, Verbal Permission Granted  Share Information with NAME: Darl Pikes     Permission granted to share info w Relationship: daughter     Emotional Assessment Appearance:: Appears stated age Attitude/Demeanor/Rapport: Unable to Assess Affect (typically observed): Unable to Assess Orientation: : Oriented to Self Alcohol / Substance Use: Not Applicable Psych Involvement: No (comment)  Admission diagnosis:  Neck pain [M54.2] Aspiration into airway, initial encounter [T17.908A] Aspiration pneumonia of both lower lobes, unspecified aspiration pneumonia type [facility]) [J69.0] Patient Active Problem List   Diagnosis Date Noted  . Aspiration into airway 02/18/2020  . Pressure injury of skin 02/18/2020  . Dementia (HCC)   . Atrial fibrillation, chronic (HCC)   . Lung nodule seen on imaging study   . Thyroid nodule   . Stroke (HCC) 04/04/2016  . History of CVA (cerebrovascular accident) 04/04/2016   PCP:  Patient, No Pcp Per Pharmacy:   [facility] [address] [place] - [address] [place] [address] Phone: [phone] Fax: [phone]     Social Determinants of Health (SDOH) Interventions    Readmission Risk Interventions No flowsheet data found.

## 2020-02-19 NOTE — Progress Notes (Signed)
Patients IV removed before discharge. Went over discharge instructions with patients daughter. Patients daughter stated that she understood.  Patient going home POV.

## 2020-02-19 NOTE — Discharge Summary (Signed)
[facility] [place] [facility]   PATIENT NAME: Sara Lowe    MR#:  [id number]  DATE OF BIRTH:  1941/01/06  DATE OF ADMISSION:  02/17/2020 ADMITTING PHYSICIAN: Briscoe Deutscher, MD  DATE OF DISCHARGE: 02/19/2020  PRIMARY CARE PHYSICIAN: Patient, No Pcp Per    ADMISSION DIAGNOSIS:  Neck pain [M54.2] Aspiration into airway, initial encounter [T17.908A] Aspiration pneumonia of both lower lobes, unspecified aspiration pneumonia type (HCC) [J69.0]  DISCHARGE DIAGNOSIS:  aspiration pneumonitis  SECONDARY DIAGNOSIS:   Past Medical History:  Diagnosis Date  . Atrial fibrillation, chronic (HCC)   . Dementia (HCC)   . Dementia (HCC)   . History of CVA (cerebrovascular accident) 04/04/2016    HOSPITAL COURSE:  Sara Nethery Phillipsis a 79 y.o.femalewith medical history significant fordementia, history of CVA, atrial fibrillation no longer on anticoagulation, now presenting to the emergency department for evaluation of choking and coughing while eating. She is accompanied by her daughter who assists with the history. Patient has reportedly been pocketing food, regurgitating food stuff and secretions, and seemed to be choking and coughing when attempting to eat or drink yesterday. Patient's daughter reports manually removing partially chewed food and also a piece of plastic from the patient's mouth   1.AspirationPneumonia -Presents with difficulty swallowing, coughing after eating or drinking, and concern for aspiration  - CT findings are consistent with aspiration -She is not tachypneic, saturating well on rm air, and febrile -Blood cultures negative -IV Unasyn started-- change to oral Augmentin - consult SLP appreciated follow diet recommendations -procalcitonin <0.10--however pt has fever and will cont and continue Unasyn for now -patient afebrile doing well  2.Dementiawith behavioral issues -Ambulates independently, eats with assistance,  becomes agitated frequently, and is oriented to person only at baseline  3.History of CVA -Statin and aspirin \  4.Atrial fibrillationchronic -Patient has chronic atrial fibrillation, not on anticoagulation -Rate is 90s to low 100s in ED, resume digoxin   5. Incidental imaging findings  - Patient was noted to have a lung nodule in the right apex and multinodular goiter  - Outpatient follow-up and imaging is recommended   DVT prophylaxis:Lovenox Code Status:DNR, confirmed with daughter Family Communication:grand daughter updated at bedside Disposition Plan: Patient is from:Home Anticipated d/c is to: home Anticipated d/c is: today Patient currently:medically best at baseline consults called:None Admission status: inpatient  discussed with granddaughter and daughter on the phone. Patient will go home. She is best at baseline. Should follow-up with primary care as outpatient CONSULTS OBTAINED:    DRUG ALLERGIES:  No Known Allergies  DISCHARGE MEDICATIONS:   Allergies as of 02/19/2020   No Known Allergies     Medication List    TAKE these medications   amoxicillin-clavulanate 875-125 MG tablet Commonly known as: AUGMENTIN Take 1 tablet by mouth every 12 (twelve) hours for 6 days.   aspirin EC 81 MG tablet Take 81 mg by mouth daily. Swallow whole.   Digoxin 62.5 MCG Tabs Take 0.0625 mg by mouth daily.   polyethylene glycol 17 g packet Commonly known as: MIRALAX / GLYCOLAX Take 17 g by mouth daily.   rosuvastatin 10 MG tablet Commonly known as: CRESTOR Take 1 tablet (10 mg total) by mouth daily.            Discharge Care Instructions  (From admission, onward)         Start     Ordered   02/19/20 0000  Discharge wound care:       Comments: Foam pad  dressing   02/19/20 1452          If you experience worsening of your admission symptoms, develop shortness of breath, life threatening emergency, suicidal or homicidal  thoughts you must seek medical attention immediately by calling 911 or calling your MD immediately  if symptoms less severe.  You Must read complete instructions/literature along with all the possible adverse reactions/side effects for all the Medicines you take and that have been prescribed to you. Take any new Medicines after you have completely understood and accept all the possible adverse reactions/side effects.   Please note  You were cared for by a hospitalist during your hospital stay. If you have any questions about your discharge medications or the care you received while you were in the hospital after you are discharged, you can call the unit and asked to speak with the hospitalist on call if the hospitalist that took care of you is not available. Once you are discharged, your primary care physician will handle any further medical issues. Please note that NO REFILLS for any discharge medications will be authorized once you are discharged, as it is imperative that you return to your primary care physician (or establish a relationship with a primary care physician if you do not have one) for your aftercare needs so that they can reassess your need for medications and monitor your lab values. Today   SUBJECTIVE   Confused at baseline due to dementia. Granddaughter in the room. Overall more awake alert trying to get out of bed  VITAL SIGNS:  Blood pressure (!) 113/92, pulse 67, temperature 98.3 F (36.8 C), temperature source Oral, resp. rate 18, height 5\' 4"  (1.626 m), weight 49.9 kg, SpO2 (!) 74 %.  I/O:    Intake/Output Summary (Last 24 hours) at 02/19/2020 1456 Last data filed at 02/19/2020 1045 Gross per 24 hour  Intake 808.68 ml  Output 250 ml  Net 558.68 ml    PHYSICAL EXAMINATION:  GENERAL:  79 y.o.-year-old patient lying in the bed with no acute distress. Thin frail limited exam secondary to advanced dementia LUNGS: Normal breath sounds bilaterally, no wheezing,  rales,rhonchi or crepitation. No use of accessory muscles of respiration.  CARDIOVASCULAR: S1, S2 normal. No murmurs, rubs, or gallops.  ABDOMEN: Soft, non-tender, non-distended. Bowel sounds present. No organomegaly or mass.  EXTREMITIES: No pedal edema, cyanosis, or clubbing.  NEUROLOGIC: grossly nonfocal moves all extremities.  PSYCHIATRIC: The patient is alert and confused at baseline SKIN:  Pressure Injury 02/18/20 Sacrum Stage 2 -  Partial thickness loss of dermis presenting as a shallow open injury with a red, pink wound bed without slough. (Active)  02/18/20 04/19/20  Location: Sacrum  Location Orientation:   Staging: Stage 2 -  Partial thickness loss of dermis presenting as a shallow open injury with a red, pink wound bed without slough.  Wound Description (Comments):   Present on Admission: Yes         DATA REVIEW:   CBC  Recent Labs  Lab 02/18/20 0707  WBC 9.2  HGB 12.3  HCT 36.6  PLT 147*    Chemistries  Recent Labs  Lab 02/18/20 0007 02/18/20 0007 02/18/20 0707  NA 137   < > 139  K 3.8   < > 4.0  CL 101   < > 105  CO2 23   < > 22  GLUCOSE 133*   < > 118*  BUN 22   < > 17  CREATININE 0.69   < >  0.61  CALCIUM 9.0   < > 8.7*  AST 22  --   --   ALT 8  --   --   ALKPHOS 61  --   --   BILITOT 1.5*  --   --    < > = values in this interval not displayed.    Microbiology Results   Recent Results (from the past 240 hour(s))  Blood culture (routine x 2)     Status: None (Preliminary result)   Collection Time: 02/18/20 12:07 AM   Specimen: BLOOD  Result Value Ref Range Status   Specimen Description BLOOD LEFT FA  Final   Special Requests   Final    BOTTLES DRAWN AEROBIC AND ANAEROBIC Blood Culture results may not be optimal due to an inadequate volume of blood received in culture bottles   Culture   Final    NO GROWTH 1 DAY Performed at [facility], [address] [place], [place] [address]    Report Status PENDING  Incomplete  Blood  culture (routine x 2)     Status: None (Preliminary result)   Collection Time: 02/18/20 12:10 AM   Specimen: BLOOD  Result Value Ref Range Status   Specimen Description BLOOD LEFT AC  Final   Special Requests   Final    BOTTLES DRAWN AEROBIC AND ANAEROBIC Blood Culture results may not be optimal due to an inadequate volume of blood received in culture bottles   Culture   Final    NO GROWTH 1 DAY Performed at [facility], [address], [place], [place] [address]    Report Status PENDING  Incomplete  SARS Coronavirus 2 by RT PCR (hospital order, performed in [facility] hospital lab) Nasopharyngeal Nasopharyngeal Swab     Status: None   Collection Time: 02/18/20  4:43 AM   Specimen: Nasopharyngeal Swab  Result Value Ref Range Status   SARS Coronavirus 2 NEGATIVE NEGATIVE Final    Comment: (NOTE) SARS-CoV-2 target nucleic acids are NOT DETECTED.  The SARS-CoV-2 RNA is generally detectable in upper and lower respiratory specimens during the acute phase of infection. The lowest concentration of SARS-CoV-2 viral copies this assay can detect is 250 copies / mL. A negative result does not preclude SARS-CoV-2 infection and should not be used as the sole basis for treatment or other patient management decisions.  A negative result may occur with improper specimen collection / handling, submission of specimen other than nasopharyngeal swab, presence of viral mutation(s) within the areas targeted by this assay, and inadequate number of viral copies (<250 copies / mL). A negative result must be combined with clinical observations, patient history, and epidemiological information.  Fact Sheet for Patients:   BoilerBrush.com.cy  Fact Sheet for Healthcare Providers: https://pope.com/  This test is not yet approved or  cleared by the [place] FDA and has been authorized for detection and/or diagnosis of SARS-CoV-2 by FDA under an  Emergency Use Authorization (EUA).  This EUA will remain in effect (meaning this test can be used) for the duration of the COVID-19 declaration under Section 564(b)(1) of the Act, 21 U.S.C. section 360bbb-3(b)(1), unless the authorization is terminated or revoked sooner.  Performed at [facility] [place], [address] [place], [place] [address]     RADIOLOGY:  [place] Soft Tissue  Result Date: 02/17/2020 CLINICAL DATA:  Ingestion EXAM: NECK SOFT TISSUES - 1+ VIEW COMPARISON:  None. FINDINGS: There is no evidence of retropharyngeal soft tissue swelling or epiglottic enlargement.  The cervical airway is unremarkable and no radio-opaque foreign body identified. Degenerative changes are seen in the cervical spine. IMPRESSION: Negative. Electronically Signed   By: Jonna Clark M.D.   On: 02/17/2020 22:43   CT Chest W Contrast  Result Date: 02/18/2020 CLINICAL DATA:  Dementia, choked on a piece of plastic change in mental status EXAM: CT CHEST WITH CONTRAST TECHNIQUE: Multidetector CT imaging of the chest was performed during intravenous contrast administration. CONTRAST:  75mL OMNIPAQUE IOHEXOL 300 MG/ML  SOLN COMPARISON:  None. FINDINGS: Cardiovascular: Scattered aortic atherosclerosis noted. Coronary artery calcifications are seen. There is trace pericardial effusion. The heart size is normal. Mediastinum/Nodes: There are no enlarged mediastinal, hilar or axillary lymph nodes. There is multinodular heterogeneously hypodense goiter seen within the left thyroid lobe measuring 6.3 x 3.0 cm extending into the upper mediastinum and causing rightward tracheal deviation. There also appears to be fluid obstructing the bronchioles of the posterior left lower lobe. Lungs/Pleura: Multifocal patchy areas of ill-defined peripherally based nodular opacities are noted. There is biapical scarring noted. A more spiculated pulmonary nodule seen in the anterior right upper lobe measuring 9 mm. Areas of tree-in-bud  opacities with bronchial wall thickening are seen within the lingula, right middle lobe and posterior left lower lobe. No pleural effusion is seen. For Musculoskeletal/Chest wall: There is no chest wall mass or suspicious osseous finding. No acute osseous abnormality Abdomen/pelvis: Hepatobiliary: The liver is normal in density without focal abnormality.The main portal vein is patent. No evidence of calcified gallstones, gallbladder wall thickening or biliary dilatation. Pancreas: Unremarkable. No pancreatic ductal dilatation or surrounding inflammatory changes. Spleen: Normal in size without focal abnormality. Adrenals/Urinary Tract: Both adrenal glands appear normal. The kidneys and collecting system appear normal without evidence of urinary tract calculus or hydronephrosis. Bladder is unremarkable. Stomach/Bowel: The stomach and small bowel are normal in appearance. Mildly dilated air and stool-filled colon seen throughout to the level of the rectum. The rectum is stool-filled and moderately dilated measuring 9 cm in transverse dimension. Vascular/Lymphatic: There are no enlarged mesenteric, retroperitoneal, or pelvic lymph nodes. Scattered dense aortic atherosclerosis is noted. Reproductive: The deep pelvis is grossly unremarkable. Other: No evidence of abdominal wall mass or hernia. Musculoskeletal: No acute or significant osseous findings. A dextroconvex scoliotic curvature of the lumbar spine is seen. Degenerative changes are seen throughout the thoracolumbar spine. IMPRESSION: 1. Multifocal patchy airspace opacities as well as tree-in-bud opacities within the right middle lobe and left lung base which could be due to infectious process, aspiration, or inflammatory process. 2. Ill-defined spiculated 9 mm nodule in the right lung apex. Consider one of the following in 3 months for both low-risk and high-risk individuals: (a) repeat chest CT, (b) follow-up PET-CT, or (c) tissue sampling. This recommendation  follows the consensus statement: Guidelines for Management of Incidental Pulmonary Nodules Detected on CT Images: From the Fleischner Society 2017; Radiology 2017; 284:228-243. 3. Fluid within the posterior left lower lobe bronchials, consistent with history of aspiration. 4. Trace pericardial effusion 5. Large heterogeneous multinodular goiter causing rightward tracheal deviation. Recommend thyroid US (ref: J Am Coll Radiol. 2015 Feb;12(2): 143-50). 6.  Aortic Atherosclerosis (ICD10-I70.0). 7. Moderately dilated stool-filled rectum which could be due to constipation. Electronically Signed   By: Jonna Clark M.D.   On: 02/18/2020 02:17   CT ABDOMEN PELVIS W CONTRAST  Result Date: 02/18/2020 CLINICAL DATA:  Dementia, choked on a piece of plastic change in mental status EXAM: CT CHEST WITH CONTRAST TECHNIQUE: Multidetector CT imaging of the  chest was performed during intravenous contrast administration. CONTRAST:  75mL OMNIPAQUE IOHEXOL 300 MG/ML  SOLN COMPARISON:  None. FINDINGS: Cardiovascular: Scattered aortic atherosclerosis noted. Coronary artery calcifications are seen. There is trace pericardial effusion. The heart size is normal. Mediastinum/Nodes: There are no enlarged mediastinal, hilar or axillary lymph nodes. There is multinodular heterogeneously hypodense goiter seen within the left thyroid lobe measuring 6.3 x 3.0 cm extending into the upper mediastinum and causing rightward tracheal deviation. There also appears to be fluid obstructing the bronchioles of the posterior left lower lobe. Lungs/Pleura: Multifocal patchy areas of ill-defined peripherally based nodular opacities are noted. There is biapical scarring noted. A more spiculated pulmonary nodule seen in the anterior right upper lobe measuring 9 mm. Areas of tree-in-bud opacities with bronchial wall thickening are seen within the lingula, right middle lobe and posterior left lower lobe. No pleural effusion is seen. For Musculoskeletal/Chest  wall: There is no chest wall mass or suspicious osseous finding. No acute osseous abnormality Abdomen/pelvis: Hepatobiliary: The liver is normal in density without focal abnormality.The main portal vein is patent. No evidence of calcified gallstones, gallbladder wall thickening or biliary dilatation. Pancreas: Unremarkable. No pancreatic ductal dilatation or surrounding inflammatory changes. Spleen: Normal in size without focal abnormality. Adrenals/Urinary Tract: Both adrenal glands appear normal. The kidneys and collecting system appear normal without evidence of urinary tract calculus or hydronephrosis. Bladder is unremarkable. Stomach/Bowel: The stomach and small bowel are normal in appearance. Mildly dilated air and stool-filled colon seen throughout to the level of the rectum. The rectum is stool-filled and moderately dilated measuring 9 cm in transverse dimension. Vascular/Lymphatic: There are no enlarged mesenteric, retroperitoneal, or pelvic lymph nodes. Scattered dense aortic atherosclerosis is noted. Reproductive: The deep pelvis is grossly unremarkable. Other: No evidence of abdominal wall mass or hernia. Musculoskeletal: No acute or significant osseous findings. A dextroconvex scoliotic curvature of the lumbar spine is seen. Degenerative changes are seen throughout the thoracolumbar spine. IMPRESSION: 1. Multifocal patchy airspace opacities as well as tree-in-bud opacities within the right middle lobe and left lung base which could be due to infectious process, aspiration, or inflammatory process. 2. Ill-defined spiculated 9 mm nodule in the right lung apex. Consider one of the following in 3 months for both low-risk and high-risk individuals: (a) repeat chest CT, (b) follow-up PET-CT, or (c) tissue sampling. This recommendation follows the consensus statement: Guidelines for Management of Incidental Pulmonary Nodules Detected on CT Images: From the Fleischner Society 2017; Radiology 2017; 284:228-243.  3. Fluid within the posterior left lower lobe bronchials, consistent with history of aspiration. 4. Trace pericardial effusion 5. Large heterogeneous multinodular goiter causing rightward tracheal deviation. Recommend thyroid US (ref: J Am Coll Radiol. 2015 Feb;12(2): 143-50). 6.  Aortic Atherosclerosis (ICD10-I70.0). 7. Moderately dilated stool-filled rectum which could be due to constipation. Electronically Signed   By: Jonna ClarkBindu  Avutu M.D.   On: 02/18/2020 02:17   DG Chest Port 1 View  Result Date: 02/17/2020 CLINICAL DATA:  Choked on plastic last night now with gurgling and refusing p.o. intake EXAM: PORTABLE CHEST 1 VIEW COMPARISON:  Radiograph 04/17/2011 FINDINGS: Chronically coarsened interstitial changes are present, similar to comparison exam which demonstrated hyperinflation and features of COPD. Some more patchy and streaky opacities are present in the lung bases and retrocardiac space which could be secondary to atelectatic change or sequela of aspiration. No pneumothorax or visible effusion. Stable biapical pleuroparenchymal scarring. The aorta is calcified. The remaining cardiomediastinal contours are unremarkable. No acute osseous abnormality. Dextrocurvature of the thoracolumbar  junction similar to prior. Air-filled loops of colon upper abdominal bowel are nonspecific. Remaining soft tissues are unremarkable. IMPRESSION: 1. Some mild streaky, ill-defined opacities in the lung bases could reflect atelectatic change or sequela of aspiration given recent clinical history. 2. Background of chronically coarsened interstitial changes and hyperinflation compatible with COPD and similar to prior. 3. Numerous air-filled loops of bowel in the upper abdomen, correlate for abdominal symptoms. 4.  Aortic Atherosclerosis (ICD10-I70.0). Electronically Signed   By: Kreg Shropshire M.D.   On: 02/17/2020 22:38     CODE STATUS:     Code Status Orders  (From admission, onward)         Start     Ordered    02/18/20 0449  Do not attempt resuscitation (DNR)  Continuous       Question Answer Comment  In the event of cardiac or respiratory ARREST Do not call a "code blue"   In the event of cardiac or respiratory ARREST Do not perform Intubation, CPR, defibrillation or ACLS   In the event of cardiac or respiratory ARREST Use medication by any route, position, wound care, and other measures to relive pain and suffering. May use oxygen, suction and manual treatment of airway obstruction as needed for comfort.      02/18/20 0448        Code Status History    Date Active Date Inactive Code Status Order ID Comments User Context   04/06/2016 0906 04/07/2016 1813 DNR [id number]  Adrian Saran, MD Inpatient   04/04/2016 1944 04/05/2016 0936 Full Code [id number]  Altamese Dilling, MD Inpatient   Advance Care Planning Activity    Advance Directive Documentation     Most Recent Value  Type of Advance Directive Healthcare Power of Attorney, Living will  Pre-existing out of facility DNR order (yellow form or pink MOST form) --  "MOST" Form in Place? --       TOTAL TIME TAKING CARE OF THIS PATIENT: *35* minutes.    Enedina Finner M.D  [facility]    CC: Primary care physician; Patient, No Pcp Per

## 2020-02-19 NOTE — Evaluation (Signed)
Clinical/Bedside Swallow Evaluation Patient Details  Name: Sara Lowe MRN: [id number] Date of Birth: Mar 12, 1941  Today's Date: 02/19/2020 Time: SLP Start Time (ACUTE ONLY): 0825 SLP Stop Time (ACUTE ONLY): 0857 SLP Time Calculation (min) (ACUTE ONLY): 32 min  Past Medical History:  Past Medical History:  Diagnosis Date   Atrial fibrillation, chronic (HCC)    Dementia (HCC)    Dementia (HCC)    History of CVA (cerebrovascular accident) 04/04/2016   Past Surgical History: History reviewed. No pertinent surgical history. HPI:  Sara Lowe is a 79 y.o. female with medical history significant for dementia, history of CVA, atrial fibrillation no longer on anticoagulation, now presenting to the emergency department for evaluation of choking and coughing while eating.  She is accompanied by her daughter who assists with the history.  Patient has reportedly been pocketing food, regurgitating food stuff and secretions, and seemed to be choking and coughing when attempting to eat or drink yesterday.  Patient's daughter reports manually removing partially chewed food and also a piece of 2x2 inch plastic from the patient's mouth   Assessment / Plan / Recommendation Clinical Impression  Pt appears to have adequate oropharyngeal abilities to consume puree and thin liquids (administered via pt's sippy cup). Pt was free of overt s/s of aspiration when consuming the above. Given pt's extensive history of pocketing minced food and her severe cognitive deficits, solids were not trialed. At this time, recommend dysphagia 1 diet with thin liquids via pt's sippy cup with medicine crushed in puree and STRICT ASPIRATION PRECAUTIONS. Education provided to pt's daughter. ST intervention is not indicated at this time.  SLP Visit Diagnosis: Dysphagia, unspecified (R13.10)    Aspiration Risk  Moderate aspiration risk;Risk for inadequate nutrition/hydration (d/t cognitive deficits)    Diet Recommendation  Dysphagia 1 with thin liquids via pt's sippy cup  Medication Administration: Crushed with puree    Other  Recommendations Oral Care Recommendations: Oral care BID   Follow up Recommendations None        Swallow Study   General Date of Onset: 02/18/20 HPI: Sara Lowe is a 79 y.o. female with medical history significant for dementia, history of CVA, atrial fibrillation no longer on anticoagulation, now presenting to the emergency department for evaluation of choking and coughing while eating.  She is accompanied by her daughter who assists with the history.  Patient has reportedly been pocketing food, regurgitating food stuff and secretions, and seemed to be choking and coughing when attempting to eat or drink yesterday.  Patient's daughter reports manually removing partially chewed food and also a piece of 2x2 inch plastic from the patient's mouth Type of Study: Bedside Swallow Evaluation Previous Swallow Assessment: BSE 2017 Diet Prior to this Study: NPO Temperature Spikes Noted: No Respiratory Status: Nasal cannula History of Recent Intubation: No Behavior/Cognition: Confused;Doesn't follow directions Oral Cavity Assessment: Within Functional Limits Oral Care Completed by SLP: No Oral Cavity - Dentition: Edentulous Self-Feeding Abilities: Total assist Patient Positioning: Upright in bed Baseline Vocal Quality: Not observed Volitional Cough: Cognitively unable to elicit Volitional Swallow: Unable to elicit    Oral/Motor/Sensory Function Overall Oral Motor/Sensory Function:  (adequate for puree and thin liquids)   Ice Chips Ice chips: Within functional limits Presentation: Spoon   Thin Liquid Thin Liquid: Within functional limits Presentation:  (pt's sippy cup from home)    Nectar Thick Nectar Thick Liquid: Not tested   Honey Thick Honey Thick Liquid: Not tested   Puree Puree: Within functional limits Presentation: Spoon  Solid     Solid: Not tested     Passion Lavin B.  Dreama Saa M.S., CCC-SLP, [facility] Speech-Language Pathologist Rehabilitation Services Office [phone]  Vaunda Gutterman Dreama Saa 02/19/2020,3:09 PM

## 2020-02-19 NOTE — Discharge Instructions (Signed)
Patient to follow-up with her primary care physician as before.

## 2020-02-19 NOTE — Plan of Care (Signed)
  Problem: Skin Integrity: Goal: Risk for impaired skin integrity will decrease Outcome: Progressing   Problem: Clinical Measurements: Goal: Ability to maintain clinical measurements within normal limits will improve Outcome: Progressing Goal: Diagnostic test results will improve Outcome: Progressing Goal: Respiratory complications will improve Outcome: Progressing   Problem: Nutrition: Goal: Adequate nutrition will be maintained Outcome: Progressing

## 2020-02-23 LAB — CULTURE, BLOOD (ROUTINE X 2)
Culture: NO GROWTH
Culture: NO GROWTH

## 2020-03-18 MED FILL — Enoxaparin Sodium Inj 40 MG/0.4ML: SUBCUTANEOUS | Qty: 0.4 | Status: AC

## 2023-06-20 DEATH — deceased
